# Patient Record
Sex: Female | Born: 1985 | Race: Black or African American | Hispanic: No | Marital: Married | Smoking: Never smoker
Health system: Southern US, Community
[De-identification: ages and names within clinical notes are randomized; demographics above are authoritative.]

## PROBLEM LIST (undated history)

## (undated) ENCOUNTER — Inpatient Hospital Stay (HOSPITAL_COMMUNITY): Payer: Self-pay

## (undated) DIAGNOSIS — D649 Anemia, unspecified: Secondary | ICD-10-CM

## (undated) DIAGNOSIS — R51 Headache: Secondary | ICD-10-CM

## (undated) DIAGNOSIS — N809 Endometriosis, unspecified: Secondary | ICD-10-CM

## (undated) DIAGNOSIS — Z9889 Other specified postprocedural states: Secondary | ICD-10-CM

## (undated) DIAGNOSIS — R112 Nausea with vomiting, unspecified: Secondary | ICD-10-CM

## (undated) DIAGNOSIS — O24419 Gestational diabetes mellitus in pregnancy, unspecified control: Secondary | ICD-10-CM

## (undated) DIAGNOSIS — R7303 Prediabetes: Secondary | ICD-10-CM

## (undated) HISTORY — DX: Gestational diabetes mellitus in pregnancy, unspecified control: O24.419

## (undated) HISTORY — DX: Endometriosis, unspecified: N80.9

## (undated) HISTORY — PX: DILATION AND CURETTAGE OF UTERUS: SHX78

---

## 2012-11-28 ENCOUNTER — Encounter: Payer: Self-pay | Admitting: Obstetrics & Gynecology

## 2012-11-28 ENCOUNTER — Ambulatory Visit (INDEPENDENT_AMBULATORY_CARE_PROVIDER_SITE_OTHER): Payer: BC Managed Care – PPO | Admitting: Obstetrics & Gynecology

## 2012-11-28 VITALS — BP 123/87 | HR 92 | Temp 98.4°F | Ht 60.0 in | Wt 175.8 lb

## 2012-11-28 DIAGNOSIS — Z3202 Encounter for pregnancy test, result negative: Secondary | ICD-10-CM

## 2012-11-28 DIAGNOSIS — Z01419 Encounter for gynecological examination (general) (routine) without abnormal findings: Secondary | ICD-10-CM

## 2012-11-28 DIAGNOSIS — N949 Unspecified condition associated with female genital organs and menstrual cycle: Secondary | ICD-10-CM

## 2012-11-28 LAB — RPR

## 2012-11-28 MED ORDER — NORETHIN ACE-ETH ESTRAD-FE 1-20 MG-MCG(24) PO TABS: 1.0000 | ORAL_TABLET | Freq: Every day | ORAL | Status: DC

## 2012-11-28 NOTE — Progress Notes (Signed)
.   Subjective:     Samantha Lee is a 27 y.o. female here for a problem exam.  Current complaints: abdominal pain for the last couple of years.  Over the past month the pain has worsen.   She describes the pain as cramping and pressure that comes and goes.  She went to [facility] last week and had testing done that did not show anything.  Personal health questionnaire reviewed: yes.   Gynecologic History Patient's last menstrual period was 11/22/2012. Contraception: none Last Pap: 2009. Results were: normal Last mammogram: N/A  Obstetric History OB History   Grav Para Term Preterm Abortions TAB SAB Ect Mult Living                   The following portions of the patient's history were reviewed and updated as appropriate: allergies, current medications, past family history, past medical history, past social history, past surgical history and problem list.  Review of Systems Pertinent items are noted in HPI.    Objective:    General appearance: alert Breasts: normal appearance, no masses or tenderness Abdomen: soft, non-tender; bowel sounds normal; no masses,  no organomegaly Pelvic: cervix normal in appearance, external genitalia normal, no adnexal masses or tenderness, uterus normal size, shape, and consistency and vagina normal without discharge, tenderness on palpation of the posterior CDS    Assessment:    Pelvic pain  Plan:    Will proceed w/a diagnostic laparoscopy.  The risks/benefits were reviewed with the pt.

## 2012-11-28 NOTE — Patient Instructions (Signed)
Diagnostic Laparoscopy Laparoscopy is a surgical procedure. It is used to diagnose and treat diseases inside the belly(abdomen). It is usually a brief, common, and relatively simple procedure. The laparoscopeis a thin, lighted, pencil-sized instrument. It is like a telescope. It is inserted into your abdomen through a small cut (incision). Your caregiver can look at the organs inside your body through this instrument. He or she can see if there is anything abnormal. Laparoscopy can be done either in a hospital or outpatient clinic. You may be given a mild sedative to help you relax before the procedure. Once in the operating room, you will be given a drug to make you sleep (general anesthesia). Laparoscopy usually lasts less than 1 hour. After the procedure, you will be monitored in a recovery area until you are stable and doing well. Once you are home, it will take 2 to 3 days to fully recover. RISKS AND COMPLICATIONS  Laparoscopy has relatively few risks. Your caregiver will discuss the risks with you before the procedure. Some problems that can occur include:  Infection.  Bleeding.  Damage to other organs.  Anesthetic side effects. PROCEDURE Once you receive anesthesia, your surgeon inflates the abdomen with a harmless gas (carbon dioxide). This makes the organs easier to see. The laparoscope is inserted into the abdomen through a small incision. This allows your surgeon to see into the abdomen. Other small instruments are also inserted into the abdomen through other small openings. Many surgeons attach a video camera to the laparoscope to enlarge the view. During a diagnostic laparoscopy, the surgeon may be looking for inflammation, infection, or cancer. Your surgeon may take tissue samples(biopsies). The samples are sent to a specialist in looking at cells and tissue samples (pathologist). The pathologist examines them under a microscope. Biopsies can help to diagnose or confirm a  disease. AFTER THE PROCEDURE   The gas is released from inside the abdomen.  The incisions are closed with stitches (sutures). Because these incisions are small (usually less than 1/2 inch), there is usually minimal discomfort after the procedure. There may be some mild discomfort in the throat. This is from the tube placed in the throat while you were sleeping. You may have some mild abdominal discomfort. There may also be discomfort from the instrument placement incisions in the abdomen.  The recovery time is shortened as long as there are no complications.  You will rest in a recovery room until stable and doing well. As long as there are no complications, you may be allowed to go home. FINDING OUT THE RESULTS OF YOUR TEST Not all test results are available during your visit. If your test results are not back during the visit, make an appointment with your caregiver to find out the results. Do not assume everything is normal if you have not heard from your caregiver or the medical facility. It is important for you to follow up on all of your test results. HOME CARE INSTRUCTIONS   Take all medicines as directed.  Only take over-the-counter or prescription medicines for pain, discomfort, or fever as directed by your caregiver.  Resume daily activities as directed.  Showers are preferred over baths.  You may resume sexual activities in 1 week or as directed.  Do not drive while taking narcotics. SEEK MEDICAL CARE IF:   There is increasing abdominal pain.  There is new pain in the shoulders (shoulder strap areas).  You feel lightheaded or faint.  You have the chills.  You or your  child has an oral temperature above 102 F (38.9 C).  There is pus-like (purulent) drainage from any of the wounds.  You are unable to pass gas or have a bowel movement.  You feel sick to your stomach (nauseous) or throw up (vomit). MAKE SURE YOU:   Understand these instructions.  Will watch  your condition.  Will get help right away if you are not doing well or get worse. Document Released: 08/27/2000 Document Revised: 08/13/2011 Document Reviewed: 05/21/2007 [facility] Patient Information 2014 [place], [place]. Pelvic Pain, Female Female pelvic pain can be caused by many different things and start from a variety of places. Pelvic pain refers to pain that is located in the lower half of the abdomen and between your hips. The pain may occur over a short period of time (acute) or may be reoccurring (chronic). The cause of pelvic pain may be related to disorders affecting the female reproductive organs (gynecologic), but it may also be related to the bladder, kidney stones, an intestinal complication, or muscle or skeletal problems. Getting help right away for pelvic pain is important, especially if there has been severe, sharp, or a sudden onset of unusual pain. It is also important to get help right away because some types of pelvic pain can be life threatening.  CAUSES  Below are only some of the causes of pelvic pain. The causes of pelvic pain can be in one of several categories.   Gynecologic.  Pelvic inflammatory disease.  Sexually transmitted infection.  Ovarian cyst or a twisted ovarian ligament (ovarian torsion).  Uterine lining that grows outside the uterus (endometriosis).  Fibroids, cysts, or tumors.  Ovulation.  Pregnancy.  Pregnancy that occurs outside the uterus (ectopic pregnancy).  Miscarriage.  Labor.  Abruption of the placenta or ruptured uterus.  Infection.  Uterine infection (endometritis).  Bladder infection.  Diverticulitis.  Miscarriage related to a uterine infection (septic abortion).  Bladder.  Inflammation of the bladder (cystitis).  Kidney stone(s).  Gastrointenstinal.  Constipation.  Diverticulitis.  Neurologic.  Trauma.  Feeling pelvic pain because of mental or emotional causes (psychosomatic).  Cancers of the bowel or  pelvis. EVALUATION  Your caregiver will want to take a careful history of your concerns. This includes recent changes in your health, a careful gynecologic history of your periods (menses), and a sexual history. Obtaining your family history and medical history is also important. Your caregiver may suggest a pelvic exam. A pelvic exam will help identify the location and severity of the pain. It also helps in the evaluation of which organ system may be involved. In order to identify the cause of the pelvic pain and be properly treated, your caregiver may order tests. These tests may include:   A pregnancy test.  Pelvic ultrasonography.  An X-ray exam of the abdomen.  A urinalysis or evaluation of vaginal discharge.  Blood tests. HOME CARE INSTRUCTIONS   Only take over-the-counter or prescription medicines for pain, discomfort, or fever as directed by your caregiver.   Rest as directed by your caregiver.   Eat a balanced diet.   Drink enough fluids to make your urine clear or pale yellow, or as directed.   Avoid sexual intercourse if it causes pain.   Apply warm or cold compresses to the lower abdomen depending on which one helps the pain.   Avoid stressful situations.   Keep a journal of your pelvic pain. Write down when it started, where the pain is located, and if there are things that seem  to be associated with the pain, such as food or your menstrual cycle.  Follow up with your caregiver as directed.  SEEK MEDICAL CARE IF:  Your medicine does not help your pain.  You have abnormal vaginal discharge. SEEK IMMEDIATE MEDICAL CARE IF:   You have heavy bleeding from the vagina.   Your pelvic pain increases.   You feel lightheaded or faint.   You have chills.   You have pain with urination or blood in your urine.   You have uncontrolled diarrhea or vomiting.   You have a fever or persistent symptoms for more than 3 days.  You have a fever and your  symptoms suddenly get worse.   You are being physically or sexually abused.  MAKE SURE YOU:  Understand these instructions.  Will watch your condition.  Will get help if you are not doing well or get worse. Document Released: 04/17/2004 Document Revised: 11/20/2011 Document Reviewed: 09/10/2011 [facility] Patient Information 2014 [place], [place].

## 2012-12-01 LAB — PAP IG, CT-NG, RFX HPV ASCU
Chlamydia Probe Amp: NEGATIVE
GC Probe Amp: NEGATIVE

## 2012-12-07 ENCOUNTER — Other Ambulatory Visit: Payer: Self-pay | Admitting: *Deleted

## 2012-12-07 DIAGNOSIS — N949 Unspecified condition associated with female genital organs and menstrual cycle: Secondary | ICD-10-CM

## 2012-12-09 ENCOUNTER — Other Ambulatory Visit: Payer: BC Managed Care – PPO

## 2012-12-12 ENCOUNTER — Ambulatory Visit (HOSPITAL_COMMUNITY)
Admission: RE | Admit: 2012-12-12 | Discharge: 2012-12-12 | Disposition: A | Discharge status: Home / Self Care | Payer: BC Managed Care – PPO | Source: Ambulatory Visit | Attending: Obstetrics & Gynecology | Admitting: Obstetrics & Gynecology

## 2012-12-12 DIAGNOSIS — N938 Other specified abnormal uterine and vaginal bleeding: Secondary | ICD-10-CM | POA: Insufficient documentation

## 2012-12-12 DIAGNOSIS — N949 Unspecified condition associated with female genital organs and menstrual cycle: Secondary | ICD-10-CM

## 2012-12-12 DIAGNOSIS — N854 Malposition of uterus: Secondary | ICD-10-CM | POA: Insufficient documentation

## 2012-12-15 ENCOUNTER — Other Ambulatory Visit: Payer: Self-pay | Admitting: *Deleted

## 2012-12-15 ENCOUNTER — Telehealth: Payer: Self-pay | Admitting: *Deleted

## 2012-12-15 MED ORDER — MEFENAMIC ACID 250 MG PO CAPS: ORAL_CAPSULE | ORAL | Status: DC

## 2012-12-26 ENCOUNTER — Encounter: Payer: Self-pay | Admitting: Obstetrics & Gynecology

## 2012-12-31 ENCOUNTER — Encounter (HOSPITAL_COMMUNITY): Payer: Self-pay

## 2012-12-31 ENCOUNTER — Encounter (HOSPITAL_COMMUNITY)
Admission: RE | Admit: 2012-12-31 | Discharge: 2012-12-31 | Disposition: A | Discharge status: Home / Self Care | Payer: BC Managed Care – PPO | Source: Ambulatory Visit | Attending: Obstetrics & Gynecology | Admitting: Obstetrics & Gynecology

## 2012-12-31 DIAGNOSIS — Z01818 Encounter for other preprocedural examination: Secondary | ICD-10-CM | POA: Insufficient documentation

## 2012-12-31 DIAGNOSIS — Z01812 Encounter for preprocedural laboratory examination: Secondary | ICD-10-CM | POA: Insufficient documentation

## 2012-12-31 HISTORY — DX: Headache: R51

## 2012-12-31 LAB — CBC
HCT: 38.9 % (ref 36.0–46.0)
Hemoglobin: 13.2 g/dL (ref 12.0–15.0)
MCHC: 33.9 g/dL (ref 30.0–36.0)
MCV: 90 fL (ref 78.0–100.0)

## 2012-12-31 NOTE — Patient Instructions (Addendum)
   Your procedure is scheduled on:  Thursday, August 14  Enter through the [facility] at: 1030 am Pick up the phone at the desk and dial [phone] and inform us of your arrival.  Please call this number if you have any problems the morning of surgery: [phone]  Remember: Do not eat food after midnight Wednesday.   Do not drink liquids after 8 am Thursday day of surgery. Take these medicines the morning of surgery with a SIP OF WATER: None  Do not wear jewelry, make-up, or FINGER nail polish No metal in your hair or on your body. Do not wear lotions, powders, perfumes. You may wear deodorant.  Please use your CHG wash as directed prior to surgery.  Do not shave anywhere for at least 12 hours prior to first CHG shower.  Do not bring valuables to the hospital. Contacts, dentures or bridgework may not be worn into surgery.  Patients discharged on the day of surgery will not be allowed to drive home.  Home with husband "Junior"

## 2013-01-01 ENCOUNTER — Encounter: Payer: Self-pay | Admitting: Obstetrics & Gynecology

## 2013-01-08 ENCOUNTER — Encounter (HOSPITAL_COMMUNITY): Payer: Self-pay | Admitting: Pharmacist

## 2013-01-12 ENCOUNTER — Other Ambulatory Visit: Payer: Self-pay | Admitting: *Deleted

## 2013-01-12 DIAGNOSIS — R102 Pelvic and perineal pain: Secondary | ICD-10-CM

## 2013-01-12 MED ORDER — TRAMADOL HCL 50 MG PO TABS: 50.0000 mg | ORAL_TABLET | Freq: Four times a day (QID) | ORAL | Status: DC | PRN

## 2013-01-13 ENCOUNTER — Other Ambulatory Visit: Payer: Self-pay | Admitting: *Deleted

## 2013-01-13 ENCOUNTER — Encounter: Payer: Self-pay | Admitting: Obstetrics & Gynecology

## 2013-01-13 ENCOUNTER — Encounter: Payer: Self-pay | Admitting: Obstetrics

## 2013-01-13 DIAGNOSIS — R102 Pelvic and perineal pain: Secondary | ICD-10-CM

## 2013-01-13 MED ORDER — TRAMADOL HCL 50 MG PO TABS: 50.0000 mg | ORAL_TABLET | Freq: Four times a day (QID) | ORAL | Status: DC | PRN

## 2013-01-15 ENCOUNTER — Encounter (HOSPITAL_COMMUNITY): Payer: Self-pay | Admitting: Anesthesiology

## 2013-01-15 ENCOUNTER — Encounter (HOSPITAL_COMMUNITY)
Admission: RE | Disposition: A | Discharge status: Home / Self Care | Payer: Self-pay | Source: Ambulatory Visit | Attending: Obstetrics & Gynecology

## 2013-01-15 ENCOUNTER — Ambulatory Visit (HOSPITAL_COMMUNITY): Payer: BC Managed Care – PPO | Admitting: Anesthesiology

## 2013-01-15 ENCOUNTER — Ambulatory Visit (HOSPITAL_COMMUNITY)
Admission: RE | Admit: 2013-01-15 | Discharge: 2013-01-15 | Disposition: A | Discharge status: Home / Self Care | Payer: BC Managed Care – PPO | Source: Ambulatory Visit | Attending: Obstetrics & Gynecology | Admitting: Obstetrics & Gynecology

## 2013-01-15 DIAGNOSIS — N809 Endometriosis, unspecified: Secondary | ICD-10-CM

## 2013-01-15 DIAGNOSIS — N949 Unspecified condition associated with female genital organs and menstrual cycle: Secondary | ICD-10-CM

## 2013-01-15 DIAGNOSIS — N736 Female pelvic peritoneal adhesions (postinfective): Secondary | ICD-10-CM

## 2013-01-15 DIAGNOSIS — N803 Endometriosis of pelvic peritoneum, unspecified: Secondary | ICD-10-CM | POA: Insufficient documentation

## 2013-01-15 DIAGNOSIS — G8929 Other chronic pain: Secondary | ICD-10-CM | POA: Insufficient documentation

## 2013-01-15 DIAGNOSIS — Z01419 Encounter for gynecological examination (general) (routine) without abnormal findings: Secondary | ICD-10-CM

## 2013-01-15 HISTORY — PX: LAPAROSCOPY: SHX197

## 2013-01-15 HISTORY — PX: LAPAROSCOPIC LYSIS OF ADHESIONS: SHX5905

## 2013-01-15 SURGERY — LAPAROSCOPY, DIAGNOSTIC
Anesthesia: General | Site: Abdomen | Laterality: Right | Wound class: Clean Contaminated

## 2013-01-15 MED ORDER — FENTANYL CITRATE 0.05 MG/ML IJ SOLN: 25.0000 ug | INTRAMUSCULAR | Status: DC | PRN

## 2013-01-15 MED ORDER — FENTANYL CITRATE 0.05 MG/ML IJ SOLN
INTRAMUSCULAR | Status: AC
  Filled 2013-01-15: qty 5

## 2013-01-15 MED ORDER — FENTANYL CITRATE 0.05 MG/ML IJ SOLN
INTRAMUSCULAR | Status: DC | PRN
  Administered 2013-01-15: 50 ug via INTRAVENOUS
  Administered 2013-01-15 (×2): 100 ug via INTRAVENOUS
  Administered 2013-01-15: 50 ug via INTRAVENOUS

## 2013-01-15 MED ORDER — GLYCOPYRROLATE 0.2 MG/ML IJ SOLN
INTRAMUSCULAR | Status: DC | PRN
  Administered 2013-01-15: 0.1 mg via INTRAVENOUS
  Administered 2013-01-15: .2 mg via INTRAVENOUS

## 2013-01-15 MED ORDER — NEOSTIGMINE METHYLSULFATE 1 MG/ML IJ SOLN
INTRAMUSCULAR | Status: DC | PRN
  Administered 2013-01-15: 1 mg via INTRAVENOUS

## 2013-01-15 MED ORDER — ACETAMINOPHEN 325 MG PO TABS: 650.0000 mg | ORAL_TABLET | ORAL | Status: DC | PRN

## 2013-01-15 MED ORDER — GLYCOPYRROLATE 0.2 MG/ML IJ SOLN
INTRAMUSCULAR | Status: AC
  Filled 2013-01-15: qty 3

## 2013-01-15 MED ORDER — OXYCODONE-ACETAMINOPHEN 5-325 MG PO TABS
ORAL_TABLET | ORAL | Status: AC
  Administered 2013-01-15: 1
  Filled 2013-01-15: qty 1

## 2013-01-15 MED ORDER — MIDAZOLAM HCL 2 MG/2ML IJ SOLN
INTRAMUSCULAR | Status: AC
  Filled 2013-01-15: qty 2

## 2013-01-15 MED ORDER — FENTANYL CITRATE 0.05 MG/ML IJ SOLN
INTRAMUSCULAR | Status: AC
  Administered 2013-01-15: 25 ug via INTRAVENOUS
  Filled 2013-01-15: qty 2

## 2013-01-15 MED ORDER — INDIGOTINDISULFONATE SODIUM 8 MG/ML IJ SOLN
INTRAMUSCULAR | Status: AC
  Filled 2013-01-15: qty 5

## 2013-01-15 MED ORDER — PROPOFOL 10 MG/ML IV BOLUS
INTRAVENOUS | Status: DC | PRN
  Administered 2013-01-15: 150 mg via INTRAVENOUS

## 2013-01-15 MED ORDER — FENTANYL CITRATE 0.05 MG/ML IJ SOLN
INTRAMUSCULAR | Status: AC
  Filled 2013-01-15: qty 2

## 2013-01-15 MED ORDER — ONDANSETRON HCL 4 MG/2ML IJ SOLN
INTRAMUSCULAR | Status: AC
  Administered 2013-01-15: 4 mg via INTRAVENOUS
  Filled 2013-01-15: qty 2

## 2013-01-15 MED ORDER — KETOROLAC TROMETHAMINE 30 MG/ML IJ SOLN: 30.0000 mg | Freq: Four times a day (QID) | INTRAMUSCULAR | Status: DC

## 2013-01-15 MED ORDER — MIDAZOLAM HCL 5 MG/5ML IJ SOLN
INTRAMUSCULAR | Status: DC | PRN
  Administered 2013-01-15: 2 mg via INTRAVENOUS

## 2013-01-15 MED ORDER — HYDROMORPHONE HCL PF 1 MG/ML IJ SOLN: INTRAMUSCULAR | Status: DC | PRN

## 2013-01-15 MED ORDER — ROCURONIUM BROMIDE 50 MG/5ML IV SOLN
INTRAVENOUS | Status: AC
  Filled 2013-01-15: qty 1

## 2013-01-15 MED ORDER — SODIUM CHLORIDE 0.9 % IV SOLN: 250.0000 mL | INTRAVENOUS | Status: DC | PRN

## 2013-01-15 MED ORDER — ONDANSETRON HCL 4 MG/2ML IJ SOLN: 4.0000 mg | Freq: Four times a day (QID) | INTRAMUSCULAR | Status: DC | PRN

## 2013-01-15 MED ORDER — BUPIVACAINE HCL (PF) 0.25 % IJ SOLN
INTRAMUSCULAR | Status: DC | PRN
  Administered 2013-01-15: 10 mL

## 2013-01-15 MED ORDER — ONDANSETRON HCL 4 MG/2ML IJ SOLN
INTRAMUSCULAR | Status: AC
  Filled 2013-01-15: qty 2

## 2013-01-15 MED ORDER — SODIUM CHLORIDE 0.9 % IJ SOLN: 3.0000 mL | Freq: Two times a day (BID) | INTRAMUSCULAR | Status: DC

## 2013-01-15 MED ORDER — DEXAMETHASONE SODIUM PHOSPHATE 10 MG/ML IJ SOLN
INTRAMUSCULAR | Status: DC | PRN
  Administered 2013-01-15: 10 mg via INTRAVENOUS

## 2013-01-15 MED ORDER — LIDOCAINE HCL (CARDIAC) 20 MG/ML IV SOLN
INTRAVENOUS | Status: AC
  Filled 2013-01-15: qty 5

## 2013-01-15 MED ORDER — OXYCODONE-ACETAMINOPHEN 5-325 MG PO TABS: 2.0000 | ORAL_TABLET | Freq: Four times a day (QID) | ORAL | Status: DC | PRN

## 2013-01-15 MED ORDER — KETOROLAC TROMETHAMINE 30 MG/ML IJ SOLN
INTRAMUSCULAR | Status: DC | PRN
  Administered 2013-01-15: 30 mg via INTRAVENOUS

## 2013-01-15 MED ORDER — PROMETHAZINE HCL 25 MG/ML IJ SOLN: 6.2500 mg | INTRAMUSCULAR | Status: DC | PRN

## 2013-01-15 MED ORDER — LACTATED RINGERS IR SOLN
Status: DC | PRN
  Administered 2013-01-15: 3000 mL

## 2013-01-15 MED ORDER — SCOPOLAMINE 1 MG/3DAYS TD PT72
MEDICATED_PATCH | TRANSDERMAL | Status: AC
  Administered 2013-01-15: 1.5 mg
  Filled 2013-01-15: qty 1

## 2013-01-15 MED ORDER — PROPOFOL 10 MG/ML IV EMUL
INTRAVENOUS | Status: AC
  Filled 2013-01-15: qty 20

## 2013-01-15 MED ORDER — MEPERIDINE HCL 25 MG/ML IJ SOLN: 6.2500 mg | INTRAMUSCULAR | Status: DC | PRN

## 2013-01-15 MED ORDER — MIDAZOLAM HCL 2 MG/2ML IJ SOLN: 0.5000 mg | Freq: Once | INTRAMUSCULAR | Status: DC | PRN

## 2013-01-15 MED ORDER — ONDANSETRON HCL 4 MG/2ML IJ SOLN
INTRAMUSCULAR | Status: DC | PRN
  Administered 2013-01-15: 4 mg via INTRAVENOUS

## 2013-01-15 MED ORDER — ROCURONIUM BROMIDE 100 MG/10ML IV SOLN
INTRAVENOUS | Status: DC | PRN
  Administered 2013-01-15: 5 mg via INTRAVENOUS
  Administered 2013-01-15: 30 mg via INTRAVENOUS

## 2013-01-15 MED ORDER — OXYCODONE HCL 5 MG PO TABS: 5.0000 mg | ORAL_TABLET | ORAL | Status: DC | PRN

## 2013-01-15 MED ORDER — SODIUM CHLORIDE 0.9 % IJ SOLN: 3.0000 mL | INTRAMUSCULAR | Status: DC | PRN

## 2013-01-15 MED ORDER — ACETAMINOPHEN 650 MG RE SUPP
650.0000 mg | RECTAL | Status: DC | PRN
  Filled 2013-01-15: qty 1

## 2013-01-15 MED ORDER — BUPIVACAINE HCL (PF) 0.25 % IJ SOLN
INTRAMUSCULAR | Status: AC
  Filled 2013-01-15: qty 30

## 2013-01-15 MED ORDER — NEOSTIGMINE METHYLSULFATE 1 MG/ML IJ SOLN
INTRAMUSCULAR | Status: AC
  Filled 2013-01-15: qty 1

## 2013-01-15 MED ORDER — LIDOCAINE HCL (CARDIAC) 20 MG/ML IV SOLN
INTRAVENOUS | Status: DC | PRN
  Administered 2013-01-15: 80 mg via INTRAVENOUS

## 2013-01-15 MED ORDER — KETOROLAC TROMETHAMINE 30 MG/ML IJ SOLN: 15.0000 mg | Freq: Once | INTRAMUSCULAR | Status: DC | PRN

## 2013-01-15 MED ORDER — LACTATED RINGERS IV SOLN
INTRAVENOUS | Status: DC
  Administered 2013-01-15 (×4): via INTRAVENOUS

## 2013-01-15 MED ORDER — METOCLOPRAMIDE HCL 5 MG/ML IJ SOLN
INTRAMUSCULAR | Status: AC
  Administered 2013-01-15: 10 mg
  Filled 2013-01-15: qty 2

## 2013-01-15 MED ORDER — DEXAMETHASONE SODIUM PHOSPHATE 10 MG/ML IJ SOLN
INTRAMUSCULAR | Status: AC
  Filled 2013-01-15: qty 1

## 2013-01-15 SURGICAL SUPPLY — 31 items
CABLE HIGH FREQUENCY MONO STRZ (ELECTRODE) ×3 IMPLANT
CATH ROBINSON RED A/P 16FR (CATHETERS) ×3 IMPLANT
CHLORAPREP W/TINT 26ML (MISCELLANEOUS) ×3 IMPLANT
CLOTH BEACON ORANGE TIMEOUT ST (SAFETY) ×3 IMPLANT
DERMABOND ADVANCED (GAUZE/BANDAGES/DRESSINGS) ×1
DERMABOND ADVANCED .7 DNX12 (GAUZE/BANDAGES/DRESSINGS) ×2 IMPLANT
FILTER SMOKE EVAC LAPAROSHD (FILTER) ×3 IMPLANT
GLOVE BIO SURGEON STRL SZ 6.5 (GLOVE) ×3 IMPLANT
GLOVE BIOGEL PI IND STRL 7.0 (GLOVE) ×2 IMPLANT
GLOVE BIOGEL PI IND STRL 8 (GLOVE) ×2 IMPLANT
GLOVE BIOGEL PI INDICATOR 7.0 (GLOVE) ×1
GLOVE BIOGEL PI INDICATOR 8 (GLOVE) ×1
GOWN PREVENTION PLUS LG XLONG (DISPOSABLE) ×6 IMPLANT
GOWN PREVENTION PLUS XLARGE (GOWN DISPOSABLE) ×3 IMPLANT
NS IRRIG 1000ML POUR BTL (IV SOLUTION) ×3 IMPLANT
PACK LAPAROSCOPY BASIN (CUSTOM PROCEDURE TRAY) ×3 IMPLANT
POUCH SPECIMEN RETRIEVAL 10MM (ENDOMECHANICALS) IMPLANT
PROTECTOR NERVE ULNAR (MISCELLANEOUS) ×3 IMPLANT
SCISSORS LAP 5X35 DISP (ENDOMECHANICALS) ×3 IMPLANT
SCRUB PCMX 4 OZ (MISCELLANEOUS) ×3 IMPLANT
SEALER TISSUE G2 CVD JAW 35 (ENDOMECHANICALS) ×2 IMPLANT
SEALER TISSUE G2 CVD JAW 45CM (ENDOMECHANICALS) ×1 IMPLANT
SET IRRIG TUBING LAPAROSCOPIC (IRRIGATION / IRRIGATOR) IMPLANT
SUT MNCRL AB 4-0 PS2 18 (SUTURE) IMPLANT
SUT VICRYL 0 UR6 27IN ABS (SUTURE) ×3 IMPLANT
SUT VICRYL 4-0 PS2 18IN ABS (SUTURE) IMPLANT
TOWEL OR 17X24 6PK STRL BLUE (TOWEL DISPOSABLE) ×6 IMPLANT
TRAY FOLEY CATH 14FR (SET/KITS/TRAYS/PACK) ×3 IMPLANT
TROCAR XCEL NON-BLD 11X100MML (ENDOMECHANICALS) ×3 IMPLANT
TROCAR XCEL NON-BLD 5MMX100MML (ENDOMECHANICALS) ×6 IMPLANT
WATER STERILE IRR 1000ML POUR (IV SOLUTION) ×3 IMPLANT

## 2013-01-15 NOTE — Op Note (Signed)
Procedure Note  LOURENE HOSTON 26 y.o. 01/15/2013  Preoperative Diagnosis:  Chronic pelvic pain Postoperative Diagnosis: Same, endometriosis; omental, uterine, anterior cul-de-sac adhesions  Procedure: Operative aparoscopy with lysis of adhesions for 45 minutes, ablation of endometriotic implants Surgeon: Antionette Char A  Indications:  The patient now presents for a diagnostic laparoscopy after discussing therapeutic alternatives.        Procedure Detail:  The patient was taken to the operating room and was placed on the operating table in the dorsal supine position.  After his satisfactory general anesthesia was achieved, the patient was placed in the semi-lithotomy position using Allen stirrups. The patient was prepped and draped in the usual sterile manner for vaginal laparoscopic procedure. A speculum was placed in the vagina. The anterior lip of the cervix was grasped with a single-tooth tenaculum. A Kahn cannula was then advanced into the uterus and secured  to the single tooth tenaculum as a  means to manipulate the uterus. The infraumbilical region was then anesthetized with local anesthesia, 0.25%  Marcaine. A small incision was made to the skin and subcutaneous tissue. A 5 mm Optiview trocar was placed through the incision into the abdominal cavity diagnostic laparoscope with video camera attached was placed through the trocar sleeve and carbon dioxide was used to insufflate the abdominal and pelvic cavity. The pelvic contents were examined and the findings were described below. Bilateral lower quadrant 5 mm ports were placed under direct visualization.  The omental adhesions to the anterior abdominal wall were then divided with the Enseal device. The broad, fibrous uterine adhesion to anterior abdominal was was divided with monopolar cautery.  The adhesions of the anterior cul-de-sac were divided with monopolar cautery and the Enseal device.  Adhesions along the left abdominal side  wall were divided with the scissors.  The endometriotic implant on the right uterosacral ligament was not excised or ablated as it was in close proximity to the ureter.  The endometriotic implants on the right abdominal side wall were either excised or ablated with monopolar cautery.  The pelvis was irrigated and adequate hemostasis was noted.   The lower quadrant ports were removed. The scope was removed and the excess carbon dioxide was remove through the port, before it was removed.  The skin incisions were reapproximated with running subcuticluar stitches of 4-0 Vicryl suture. A skin adhesive was then applied.  The instruments were removed from the vagina and there was minimal bleeding from the cervix. Final sponge, instrument and needle counts were correct. The patient was awakened on the operating table and taken to the PACU in satisfactory condition.    Findings: Right uterosacral ligament with a fibrotic plaque, tethering of the peritoneum; multiple hemorrhagic implants along the right abdominal side wall, omental adhesions to the anterior abdominal wall, uterine adhesion to the anterior abdominal wall, adhesions of the anterior cul-de-sac.  Ovaries, left abdominal sidewall and appendix all normal in appearance.  Estimated Blood Loss:  less than 100 mL              Total IV Fluids: per Anesthesiology        Condition: stable

## 2013-01-15 NOTE — Anesthesia Postprocedure Evaluation (Signed)
  Anesthesia Post-op Note  Anesthesia Post Note  Patient: Samantha Lee  Procedure(s) Performed: Procedure(s) (LRB): LAPAROSCOPY DIAGNOSTIC   (N/A) LAPAROSCOPIC LYSIS OF ADHESIONS (N/A) LAPAROSCOPY OPERATIVE   (Right)  Anesthesia type: General  Patient location: PACU  Post pain: Pain level controlled  Post assessment: Post-op Vital signs reviewed  Last Vitals:  Filed Vitals:   01/15/13 1630  BP: 103/56  Pulse: 70  Temp: 36.9 C  Resp: 16    Post vital signs: Reviewed  Level of consciousness: sedated  Complications: No apparent anesthesia complications

## 2013-01-15 NOTE — Transfer of Care (Signed)
Immediate Anesthesia Transfer of Care Note  Patient: Samantha Lee  Procedure(s) Performed: Procedure(s) with comments: LAPAROSCOPY DIAGNOSTIC   (N/A) LAPAROSCOPIC LYSIS OF ADHESIONS (N/A) - with ablation of endometriotic implants  LAPAROSCOPY OPERATIVE   (Right) - R sidewall biopsy  Patient Location: PACU  Anesthesia Type:General  Level of Consciousness: awake, alert  and oriented  Airway & Oxygen Therapy: Patient Spontanous Breathing and Patient connected to nasal cannula oxygen  Post-op Assessment: Report given to PACU RN and Post -op Vital signs reviewed and stable  Post vital signs: Reviewed and stable  Complications: No apparent anesthesia complications

## 2013-01-15 NOTE — Anesthesia Procedure Notes (Signed)
Procedure Name: Intubation Date/Time: 01/15/2013 12:36 PM Performed by: Graciela Husbands Pre-anesthesia Checklist: Suction available, Emergency Drugs available, Timeout performed, Patient identified and Patient being monitored Patient Re-evaluated:Patient Re-evaluated prior to inductionOxygen Delivery Method: Circle system utilized Preoxygenation: Pre-oxygenation with 100% oxygen Intubation Type: IV induction Ventilation: Oral airway inserted - appropriate to patient size and Mask ventilation with difficulty Laryngoscope Size: Mac and 3 Grade View: Grade I Tube type: Oral Tube size: 7.0 mm Number of attempts: 1 Airway Equipment and Method: Patient positioned with wedge pillow and Stylet Placement Confirmation: ETT inserted through vocal cords under direct vision,  positive ETCO2 and breath sounds checked- equal and bilateral Secured at: 21 cm Tube secured with: Tape Dental Injury: Teeth and Oropharynx as per pre-operative assessment  Difficulty Due To: Difficult Airway- due to large tongue

## 2013-01-15 NOTE — H&P (Signed)
  Chief Complaint: 27 y.o.  who presents with pelvic pain  Details of Present Illness: See above.  BP 111/58  Pulse 81  Temp(Src) 98.2 F (36.8 C) (Oral)  Resp 18  Ht 5\' 1"  (1.549 m)  Wt 184 lb (83.462 kg)  BMI 34.78 kg/m2  SpO2 100%  Past Medical History  Diagnosis Date  . Headache(784.0)     otc med prn   History   Social History  . Marital Status: Married    Spouse Name: N/A    Number of Children: N/A  . Years of Education: N/A   Occupational History  . Not on file.   Social History Main Topics  . Smoking status: Never Smoker   . Smokeless tobacco: Never Used  . Alcohol Use: No  . Drug Use: No  . Sexual Activity: Yes    Birth Control/ Protection: Condom   Other Topics Concern  . Not on file   Social History Narrative  . No narrative on file   Family History  Problem Relation Age of Onset  . Hypertension Mother     Pertinent items are noted in HPI.  Pre-Op Diagnosis: Pelvic pain   Planned Procedure: Procedure(s): LAPAROSCOPY DIAGNOSTIC, POSSIBLE OPERATIVE LAPAROSCOPY WITH BIOPSIES   I have reviewed the patient's history and have completed the physical exam and Samantha Lee is acceptable for surgery.  Roseanna Rainbow, MD 01/15/2013 12:06 PM

## 2013-01-15 NOTE — Anesthesia Preprocedure Evaluation (Signed)
Anesthesia Evaluation  Patient identified by MRN, date of birth, ID band Patient awake    Reviewed: Allergy & Precautions, H&P , Patient's Chart, lab work & pertinent test results, reviewed documented beta blocker date and time   History of Anesthesia Complications Negative for: history of anesthetic complications  Airway Mallampati: III TM Distance: >3 FB Neck ROM: full    Dental no notable dental hx.    Pulmonary neg pulmonary ROS,  breath sounds clear to auscultation  Pulmonary exam normal       Cardiovascular Exercise Tolerance: Good negative cardio ROS  Rhythm:regular Rate:Normal     Neuro/Psych negative neurological ROS  negative psych ROS   GI/Hepatic negative GI ROS, Neg liver ROS,   Endo/Other  negative endocrine ROS  Renal/GU negative Renal ROS     Musculoskeletal   Abdominal   Peds  Hematology negative hematology ROS (+)   Anesthesia Other Findings   Reproductive/Obstetrics negative OB ROS                           Anesthesia Physical Anesthesia Plan  ASA: II  Anesthesia Plan: General ETT   Post-op Pain Management:    Induction:   Airway Management Planned:   Additional Equipment:   Intra-op Plan:   Post-operative Plan:   Informed Consent: I have reviewed the patients History and Physical, chart, labs and discussed the procedure including the risks, benefits and alternatives for the proposed anesthesia with the patient or authorized representative who has indicated his/her understanding and acceptance.   Dental Advisory Given  Plan Discussed with: CRNA and Surgeon  Anesthesia Plan Comments:         Anesthesia Quick Evaluation

## 2013-01-16 ENCOUNTER — Encounter (HOSPITAL_COMMUNITY): Payer: Self-pay | Admitting: Obstetrics & Gynecology

## 2013-01-20 ENCOUNTER — Encounter (HOSPITAL_COMMUNITY): Payer: Self-pay | Admitting: *Deleted

## 2013-01-20 ENCOUNTER — Encounter: Payer: Self-pay | Admitting: Obstetrics & Gynecology

## 2013-01-20 ENCOUNTER — Inpatient Hospital Stay (HOSPITAL_COMMUNITY)
Admission: AD | Admit: 2013-01-20 | Discharge: 2013-01-20 | Disposition: A | Discharge status: Home / Self Care | Payer: BC Managed Care – PPO | Source: Ambulatory Visit | Attending: Obstetrics | Admitting: Obstetrics

## 2013-01-20 DIAGNOSIS — R519 Headache, unspecified: Secondary | ICD-10-CM

## 2013-01-20 DIAGNOSIS — R51 Headache: Secondary | ICD-10-CM

## 2013-01-20 DIAGNOSIS — K5900 Constipation, unspecified: Secondary | ICD-10-CM

## 2013-01-20 DIAGNOSIS — R112 Nausea with vomiting, unspecified: Secondary | ICD-10-CM

## 2013-01-20 LAB — URINALYSIS, ROUTINE W REFLEX MICROSCOPIC
Bilirubin Urine: NEGATIVE
Glucose, UA: NEGATIVE mg/dL
Hgb urine dipstick: NEGATIVE
Ketones, ur: NEGATIVE mg/dL
Leukocytes, UA: NEGATIVE
Nitrite: NEGATIVE
Protein, ur: NEGATIVE mg/dL
Specific Gravity, Urine: 1.015 (ref 1.005–1.030)
Urobilinogen, UA: 0.2 mg/dL (ref 0.0–1.0)
pH: 8 (ref 5.0–8.0)

## 2013-01-20 LAB — CBC
HCT: 38.5 % (ref 36.0–46.0)
Hemoglobin: 13.1 g/dL (ref 12.0–15.0)
MCH: 30.5 pg (ref 26.0–34.0)
MCHC: 34 g/dL (ref 30.0–36.0)
MCV: 89.7 fL (ref 78.0–100.0)
Platelets: 252 10*3/uL (ref 150–400)
RBC: 4.29 MIL/uL (ref 3.87–5.11)
RDW: 12.6 % (ref 11.5–15.5)
WBC: 11 10*3/uL — ABNORMAL HIGH (ref 4.0–10.5)

## 2013-01-20 MED ORDER — DEXAMETHASONE SODIUM PHOSPHATE 10 MG/ML IJ SOLN
10.0000 mg | Freq: Once | INTRAMUSCULAR | Status: AC
  Administered 2013-01-20: 10 mg via INTRAVENOUS
  Filled 2013-01-20: qty 1

## 2013-01-20 MED ORDER — LACTATED RINGERS IV BOLUS (SEPSIS)
500.0000 mL | Freq: Once | INTRAVENOUS | Status: AC
  Administered 2013-01-20: 17:00:00 via INTRAVENOUS

## 2013-01-20 MED ORDER — PROMETHAZINE HCL 25 MG PO TABS: 25.0000 mg | ORAL_TABLET | Freq: Four times a day (QID) | ORAL | Status: DC | PRN

## 2013-01-20 MED ORDER — DIPHENHYDRAMINE HCL 50 MG/ML IJ SOLN
25.0000 mg | Freq: Once | INTRAMUSCULAR | Status: AC
  Administered 2013-01-20: 25 mg via INTRAVENOUS
  Filled 2013-01-20: qty 1

## 2013-01-20 MED ORDER — METOCLOPRAMIDE HCL 5 MG/ML IJ SOLN
10.0000 mg | Freq: Once | INTRAMUSCULAR | Status: AC
  Administered 2013-01-20: 10 mg via INTRAVENOUS
  Filled 2013-01-20: qty 2

## 2013-01-20 MED ORDER — LACTATED RINGERS IV BOLUS (SEPSIS): 1000.0000 mL | Freq: Once | INTRAVENOUS | Status: DC

## 2013-01-20 NOTE — MAU Note (Signed)
I had a laporoscopy last THurs. I'm having n/v, headache and no BM since last TUes. My stomach is swollen.

## 2013-01-20 NOTE — MAU Provider Note (Signed)
History     CSN: [id number]  Arrival date and time: 01/20/13 1439   First Provider Initiated Contact with Patient 01/20/13 1537      Chief Complaint  Patient presents with  . Post-op Problem   HPI Ms. Samantha Renaldo Bradleyis a 27 y.o.female who presents with N/V, constipation, and headache following a laparoscopy for lysis of adhesions on 01/15/2013. She has been taking percocet for the HA with minimal relief; it puts her to sleep and when she wakes up she still has HA.  She has had nausea and each time she tries to eat she vomits. She tried a chicken biscuit this morning for breakfast and was unable to keep it down.  Last bowel movement was 1 week ago; she has tried stool softers and prune juice with no relief.  Dr. Clearance Coots instructed patient to come here to be evaluated; she has been talking to him for the past 2 days.   OB History   Grav Para Term Preterm Abortions TAB SAB Ect Mult Living   3 2 2  1  1          Past Medical History  Diagnosis Date  . Headache(784.0)     otc med prn    Past Surgical History  Procedure Laterality Date  . Cesarean section      x 2  . Dilation and curettage of uterus      mab  . Laparoscopy N/A 01/15/2013    Procedure: LAPAROSCOPY DIAGNOSTIC  ;  Surgeon: Antionette Char, MD;  Location: [facility];  Service: Gynecology;  Laterality: N/A;  . Laparoscopic lysis of adhesions N/A 01/15/2013    Procedure: LAPAROSCOPIC LYSIS OF ADHESIONS;  Surgeon: Antionette Char, MD;  Location: [facility];  Service: Gynecology;  Laterality: N/A;  with ablation of endometriotic implants   . Laparoscopy Right 01/15/2013    Procedure: LAPAROSCOPY OPERATIVE  ;  Surgeon: Antionette Char, MD;  Location: [facility];  Service: Gynecology;  Laterality: Right;  R sidewall biopsy    Family History  Problem Relation Age of Onset  . Hypertension Mother     History  Substance Use Topics  . Smoking status: Never Smoker   . Smokeless tobacco: Never Used  . Alcohol Use: No     Allergies: No Known Allergies  Prescriptions prior to admission  Medication Sig Dispense Refill  . Mefenamic Acid (PONSTEL) 250 MG CAPS 500 mg PO loading dose and then start 250 mg tablet PO every 6 hours PRN.  60 each  2  . oxyCODONE-acetaminophen (PERCOCET) 5-325 MG per tablet Take 2 tablets by mouth every 6 (six) hours as needed for pain.  60 tablet  0  . Norethindrone Acetate-Ethinyl Estrad-FE (LOESTRIN 24 FE) 1-20 MG-MCG(24) tablet Take 1 tablet by mouth daily.  1 Package  11   Results for orders placed during the hospital encounter of 01/20/13 (from the past 24 hour(s))  URINALYSIS, ROUTINE W REFLEX MICROSCOPIC     Status: None   Collection Time    01/20/13  3:10 PM      Result Value Range   Color, Urine YELLOW  YELLOW   APPearance CLEAR  CLEAR   Specific Gravity, Urine 1.015  1.005 - 1.030   pH 8.0  5.0 - 8.0   Glucose, UA NEGATIVE  NEGATIVE mg/dL   Hgb urine dipstick NEGATIVE  NEGATIVE   Bilirubin Urine NEGATIVE  NEGATIVE   Ketones, ur NEGATIVE  NEGATIVE mg/dL   Protein, ur NEGATIVE  NEGATIVE mg/dL   Urobilinogen,  UA 0.2  0.0 - 1.0 mg/dL   Nitrite NEGATIVE  NEGATIVE   Leukocytes, UA NEGATIVE  NEGATIVE  CBC     Status: Abnormal   Collection Time    01/20/13  4:10 PM      Result Value Range   WBC 11.0 (*) 4.0 - 10.5 K/uL   RBC 4.29  3.87 - 5.11 MIL/uL   Hemoglobin 13.1  12.0 - 15.0 g/dL   HCT 32.4  40.1 - 02.7 %   MCV 89.7  78.0 - 100.0 fL   MCH 30.5  26.0 - 34.0 pg   MCHC 34.0  30.0 - 36.0 g/dL   RDW 25.3  66.4 - 40.3 %   Platelets 252  150 - 400 K/uL   Review of Systems  Constitutional: Positive for chills. Negative for fever.  Cardiovascular: Negative for leg swelling.  Gastrointestinal: Positive for nausea, vomiting, abdominal pain and constipation.       Bilateral lower abdominal pain   Genitourinary: Negative for dysuria.  Neurological: Positive for headaches.   Physical Exam   Blood pressure 119/75, pulse 77, temperature 98.4 F (36.9 C), resp.  rate 18, last menstrual period 11/22/2012.  Physical Exam  Constitutional: She is oriented to person, place, and time. She appears well-developed and well-nourished. No distress.  Neck: Neck supple.  Cardiovascular: Normal rate.   Respiratory: Breath sounds normal. No respiratory distress.  GI: Soft. She exhibits distension. There is tenderness. There is no rebound and no guarding.  Tenderness at incision sites.  Bowel sounds hypoactive in all 4 quadrants   Musculoskeletal: She exhibits no edema.  Neurological: She is alert and oriented to person, place, and time.  Skin: Skin is warm.    MAU Course  Procedures  IV access 500 CC LR infusion Headache cocktail: Benadryl 25 mg IV, Reglan 10 mg IV, Decadron 10 mg IV  CBC Headache pain 0/10 upon discharge home  Assessment and Plan  A: Headache Constipation    P:  Discharge home Phenergan as needed for N/V 25 mg PO Q6 hours as needed (#20) No RF If pain resumes, follow up with Dr. Bary Leriche  as indicated on the bottle; first dose tonight If unsuccessful BM with Miralax, ok to try enema over the counter.  Increase your fluids to 8 glasses of water per day.    RASCH, JENNIFER IRENE FNP-C 01/20/2013, 3:37 PM

## 2013-01-21 ENCOUNTER — Encounter: Payer: Self-pay | Admitting: Obstetrics

## 2013-01-28 ENCOUNTER — Encounter: Payer: Self-pay | Admitting: Obstetrics & Gynecology

## 2013-01-28 ENCOUNTER — Ambulatory Visit (INDEPENDENT_AMBULATORY_CARE_PROVIDER_SITE_OTHER): Payer: BC Managed Care – PPO | Admitting: Obstetrics & Gynecology

## 2013-01-28 VITALS — BP 104/80 | HR 76 | Temp 99.0°F

## 2013-01-28 DIAGNOSIS — N23 Unspecified renal colic: Secondary | ICD-10-CM

## 2013-01-28 DIAGNOSIS — N809 Endometriosis, unspecified: Secondary | ICD-10-CM | POA: Insufficient documentation

## 2013-01-28 DIAGNOSIS — R309 Painful micturition, unspecified: Secondary | ICD-10-CM

## 2013-01-28 DIAGNOSIS — Z09 Encounter for follow-up examination after completed treatment for conditions other than malignant neoplasm: Secondary | ICD-10-CM

## 2013-01-28 LAB — POCT URINALYSIS DIPSTICK
Blood, UA: NEGATIVE
Leukocytes, UA: NEGATIVE
Nitrite, UA: NEGATIVE
Protein, UA: NEGATIVE
pH, UA: 7

## 2013-01-28 NOTE — Patient Instructions (Signed)
Endometriosis  Endometriosis is a disease that occurs when the endometrium (lining of the uterus) is misplaced outside of its normal location. It may occur in many locations close to the uterus (womb), but commonly on the ovaries, fallopian tubes, vagina (birth canal) and bowel located close to the uterus. Because the uterus sloughs (expels) its lining every month (menses), there is bleeding whereever the endometrial tissue is located.  SYMPTOMS   Often there are no symptoms. However, because blood is irritating to tissues not normally exposed to it, when symptoms occur they vary with the location of the misplaced endometrium. Symptoms often include back and abdominal pain. Periods may be heavier and intercourse may be painful. Infertility may be present. You may have all of these symptoms at one time or another or you may have months with no symptoms at all. Although the symptoms occur mainly during menses, they can occur mid-cycle as well, and usually terminate with menopause.  DIAGNOSIS   Your caregiver may recommend a blood test and urine test (urinalysis) to help rule out other conditions. Another common test is ultrasound, a painless procedure that uses sound waves to make a sonogram "picture" of abnormal tissue that could be endometriosis. If your bowel movements are painful around your periods, your caregiver may advise a barium enema (an X-ray of the lower bowel), to try to find the source of your pain. This is sometimes confirmed by laparoscopy. Laparoscopy is a procedure where your caregiver looks into your abdomen with a laparoscope (a small pencil sized telescope). Your caregiver may take a tiny piece of tissue (biopsy) from any abnormal tissue to confirm or document your problem. These tissues are sent to the lab and a pathologist looks at them under the microscope to give a microscopic diagnosis.  TREATMENT   Once the diagnosis is made, it can be treated by destruction of the misplaced endometrial  tissue using heat (diathermy), laser, cutting (excision), or chemical means. It may also be treated with hormonal therapy. When using hormonal therapy menses are eliminated, therefore eliminating the monthly exposure to blood by the misplaced endometrial tissue. Only in severe cases is it necessary to perform a hysterectomy with removal of the tubes, uterus and ovaries.  HOME CARE INSTRUCTIONS    Only take over-the-counter or prescription medicines for pain, discomfort, or fever as directed by your caregiver.   Avoid activities that produce pain, including a physical sexual relationship.   Do not take aspirin as this may increase bleeding when not on hormonal therapy.   See your caregiver for pain or problems not controlled with treatment.  SEEK IMMEDIATE MEDICAL CARE IF:    Your pain is severe and is not responding to pain medicine.   You develop severe nausea and vomiting, or you cannot keep foods down.   Your pain localizes to the right lower part of your abdomen (possible appendicitis).   You have swelling or increasing pain in the abdomen.   You have a fever.   You see blood in your stool.  MAKE SURE YOU:    Understand these instructions.   Will watch your condition.   Will get help right away if you are not doing well or get worse.  Document Released: 05/18/2000 Document Revised: 08/13/2011 Document Reviewed: 01/07/2008  [employer] Patient Information 2014 [employer].

## 2013-01-28 NOTE — Progress Notes (Signed)
Subjective:     Samantha Lee is a 27 y.o. female who presents to the clinic 2 weeks status post laparoscopy for pelvic pain. Eating a regular diet with out difficulty. Bowel movements are normal. Pain is controlled with current analgesics. Medications being used: narcotic analgesics including percocet. Patient reports she is having some urinary pain.  The following portions of the patient's history were reviewed and updated as appropriate: allergies, current medications, past family history, past medical history, past social history, past surgical history and problem list.  Review of Systems Pertinent items are noted in HPI.    Objective:    BP 104/80  Pulse 76  Temp(Src) 99 F (37.2 C)  LMP 11/22/2012 General:  alert  Abdomen: soft, bowel sounds active, non-tender  Incision:   healing well, no drainage, no erythema, no hernia, no seroma, no swelling, no dehiscence, incision well approximated     Assessment:    Doing well postoperatively. Dysuria Operative findings again reviewed. Pathology report discussed.    Plan:    1. Continue any current medications. 2. Wound care discussed. 3. Activity restrictions: no intercourse 4. Anticipated return to work: now. 5. Follow up: 4 weeks  Check urine C&S Continuous COCP

## 2013-02-16 ENCOUNTER — Encounter: Payer: Self-pay | Admitting: Obstetrics & Gynecology

## 2013-03-04 ENCOUNTER — Encounter: Payer: Self-pay | Admitting: *Deleted

## 2013-03-04 ENCOUNTER — Encounter: Payer: Self-pay | Admitting: Obstetrics & Gynecology

## 2013-03-10 ENCOUNTER — Encounter: Payer: Self-pay | Admitting: Obstetrics & Gynecology

## 2013-03-11 ENCOUNTER — Encounter: Payer: Self-pay | Admitting: Obstetrics & Gynecology

## 2013-03-11 ENCOUNTER — Encounter: Payer: Self-pay | Admitting: Obstetrics

## 2013-03-11 ENCOUNTER — Ambulatory Visit (INDEPENDENT_AMBULATORY_CARE_PROVIDER_SITE_OTHER): Payer: BC Managed Care – PPO | Admitting: Obstetrics & Gynecology

## 2013-03-11 VITALS — BP 115/79 | HR 69 | Temp 99.5°F | Ht 61.0 in | Wt 195.0 lb

## 2013-03-11 DIAGNOSIS — N809 Endometriosis, unspecified: Secondary | ICD-10-CM

## 2013-03-11 DIAGNOSIS — N939 Abnormal uterine and vaginal bleeding, unspecified: Secondary | ICD-10-CM

## 2013-03-11 DIAGNOSIS — N926 Irregular menstruation, unspecified: Secondary | ICD-10-CM

## 2013-03-11 DIAGNOSIS — Z09 Encounter for follow-up examination after completed treatment for conditions other than malignant neoplasm: Secondary | ICD-10-CM

## 2013-03-11 LAB — HEMOGLOBIN: Hemoglobin: 12.7 g/dL (ref 12.0–15.0)

## 2013-03-11 MED ORDER — NAPROXEN SODIUM ER 500 MG PO TB24: 1000.0000 mg | ORAL_TABLET | Freq: Every day | ORAL | Status: DC

## 2013-03-11 MED ORDER — MEDROXYPROGESTERONE ACETATE 10 MG PO TABS: 20.0000 mg | ORAL_TABLET | Freq: Three times a day (TID) | ORAL | Status: DC

## 2013-03-11 NOTE — Addendum Note (Signed)
Addended by: Elby Beck F on: 03/11/2013 02:13 PM   Modules accepted: Orders

## 2013-03-11 NOTE — Progress Notes (Signed)
Subjective:     Samantha Lee is a 27 y.o. female here for a routine exam.  Current complaints: post-op problem visit.  Pt states she had a laparoscopy with lysis of adhesions 01-15-13. Pt states she that she started a cycle on 02-28-13 and it has continued for the last 12 days. Pt states she is having moderate to heavy bleeding. Pt states she is having severe pain since her cycle has started. Pt states she has been taking continuous birth control pills since July 2014. Pt had been instructed to start taking three birth control pills daily to help stop the bleeding. Pt is compliant with requested therapy and has continued to bleed.  Pt states she is having nausea. Pt states she is also very fatigued. Personal health questionnaire reviewed: yes.   Gynecologic History Patient's last menstrual period was 02/28/2013. Contraception: OCP (estrogen/progesterone)   Obstetric History OB History  Gravida Para Term Preterm AB SAB TAB Ectopic Multiple Living  3 2 2  1 1         # Outcome Date GA Lbr Len/2nd Weight Sex Delivery Anes PTL Lv  3 TRM 08/18/07    F LTCS     2 TRM 07/21/03    F LTCS     1 SAB                The following portions of the patient's history were reviewed and updated as appropriate: allergies, current medications, past family history, past medical history, past social history, past surgical history and problem list.  Review of Systems Pertinent items are noted in HPI.    Objective:     Abd: incisions healed/NT SPEC: scant heme Bimanual: uterus AV, globular, NT; no adnexal masses     Assessment:   AUB-?I--prolonged bleeding episode Pelvic pain, endometriosis  Plan:    High dose provera x 1 week-->COCP BID Long term treatment options reviewed PT referral Naprelan Return in 2 weeks

## 2013-03-11 NOTE — Patient Instructions (Signed)
Leuprolide depot injection or implant What is this medicine? LEUPROLIDE (loo PROE lide) is a man-made protein that acts like a natural hormone in the body. It decreases testosterone in men and decreases estrogen in women. In men, this medicine is used to treat advanced prostate cancer. In women, some forms of this medicine may be used to treat endometriosis, uterine fibroids, or other female hormone-related problems. This medicine may be used for other purposes; ask your health care provider or pharmacist if you have questions. What should I tell my health care provider before I take this medicine? They need to know if you have any of these conditions: -diabetes -heart disease or previous heart attack -high blood pressure -high cholesterol -osteoporosis -pain or difficulty passing urine -spinal cord metastasis -stroke -tobacco smoker -unusual vaginal bleeding (women) -an unusual or allergic reaction to leuprolide, benzyl alcohol, other medicines, foods, dyes, or preservatives -pregnant or trying to get pregnant -breast-feeding How should I use this medicine? This medicine is for injection into a muscle or for implant or injection under the skin. It is given by a health care professional in a hospital or clinic setting. The specific product will determine how it will be given to you. Make sure you understand which product you receive and how often you will receive it. Talk to your pediatrician regarding the use of this medicine in children. Special care may be needed. Overdosage: If you think you have taken too much of this medicine contact a poison control center or emergency room at once. NOTE: This medicine is only for you. Do not share this medicine with others. What if I miss a dose? It is important not to miss a dose. Call your doctor or health care professional if you are unable to keep an appointment. Depot injections: Depot injections are given either once-monthly, every 12 weeks,  every 16 weeks, or every 24 weeks depending on the product you are prescribed. The product you are prescribed will be based on if you are female or female, and your condition. Make sure you understand your product and dosing. Implant dosing: The implant is removed and replaced once a year. The implant is only used in males. What may interact with this medicine? Do not take this medicine with any of the following medications: -chasteberry This medicine may also interact with the following medications: -herbal or dietary supplements, like black cohosh or DHEA -female hormones, like estrogens or progestins and birth control pills, patches, rings, or injections -female hormones, like testosterone This list may not describe all possible interactions. Give your health care provider a list of all the medicines, herbs, non-prescription drugs, or dietary supplements you use. Also tell them if you smoke, drink alcohol, or use illegal drugs. Some items may interact with your medicine. What should I watch for while using this medicine? Visit your doctor or health care professional for regular checks on your progress. During the first weeks of treatment, your symptoms may get worse, but then will improve as you continue your treatment. You may get hot flashes, increased bone pain, increased difficulty passing urine, or an aggravation of nerve symptoms. Discuss these effects with your doctor or health care professional, some of them may improve with continued use of this medicine. Female patients may experience a menstrual cycle or spotting during the first months of therapy with this medicine. If this continues, contact your doctor or health care professional. What side effects may I notice from receiving this medicine? Side effects that you should report  to your doctor or health care professional as soon as possible: -allergic reactions like skin rash, itching or hives, swelling of the face, lips, or  tongue -breathing problems -chest pain -depression or memory disorders -pain in your legs or groin -pain at site where injected or implanted -severe headache -swelling of the feet and legs -visual changes -vomiting Side effects that usually do not require medical attention (report to your doctor or health care professional if they continue or are bothersome): -breast swelling or tenderness -decrease in sex drive or performance -diarrhea -hot flashes -loss of appetite -muscle, joint, or bone pains -nausea -redness or irritation at site where injected or implanted -skin problems or acne This list may not describe all possible side effects. Call your doctor for medical advice about side effects. You may report side effects to FDA at 1-800-FDA-1088. Where should I keep my medicine? This drug is given in a hospital or clinic and will not be stored at home. NOTE: This sheet is a summary. It may not cover all possible information. If you have questions about this medicine, talk to your doctor, pharmacist, or health care provider.  2013, [employer]. (11/22/2009 2:41:21 PM) Letrozole tablets What is this medicine? LETROZOLE (LET roe zole) blocks the production of estrogen. Certain types of breast cancer grow under the influence of estrogen. Letrozole helps block tumor growth. This medicine is used to treat advanced breast cancer in postmenopausal women. This medicine may be used for other purposes; ask your health care provider or pharmacist if you have questions. What should I tell my health care provider before I take this medicine? They need to know if you have any of these conditions: -liver disease -osteoporosis (weak bones) -an unusual or allergic reaction to letrozole, other medicines, foods, dyes, or preservatives -pregnant or trying to get pregnant -breast-feeding How should I use this medicine? Take this medicine by mouth with a glass of water. You may take it with  or without food. Follow the directions on the prescription label. Take your medicine at regular intervals. Do not take your medicine more often than directed. Do not stop taking except on your doctor's advice. Talk to your pediatrician regarding the use of this medicine in children. Special care may be needed. Overdosage: If you think you have taken too much of this medicine contact a poison control center or emergency room at once. NOTE: This medicine is only for you. Do not share this medicine with others. What if I miss a dose? If you miss a dose, take it as soon as you can. If it is almost time for your next dose, take only that dose. Do not take double or extra doses. What may interact with this medicine? Do not take this medicine with any of the following medications: -estrogens, like hormone replacement therapy or birth control pills This medicine may also interact with the following medications: -dietary supplements such as androstenedione or DHEA -prasterone -tamoxifen This list may not describe all possible interactions. Give your health care provider a list of all the medicines, herbs, non-prescription drugs, or dietary supplements you use. Also tell them if you smoke, drink alcohol, or use illegal drugs. Some items may interact with your medicine. What should I watch for while using this medicine? Visit your doctor or health care professional for regular check-ups to monitor your condition. Do not use this drug if you are pregnant. Serious side effects to an unborn child are possible. Talk to your doctor or pharmacist for more information.  You may get drowsy or dizzy. Do not drive, use machinery, or do anything that needs mental alertness until you know how this medicine affects you. Do not stand or sit up quickly, especially if you are an older patient. This reduces the risk of dizzy or fainting spells. What side effects may I notice from receiving this medicine? Side effects that you  should report to your doctor or health care professional as soon as possible: -allergic reactions like skin rash, itching, or hives -bone fracture -chest pain -difficulty breathing or shortness of breath -severe pain, swelling, warmth in the leg -unusually weak or tired -vaginal bleeding Side effects that usually do not require medical attention (report to your doctor or health care professional if they continue or are bothersome): -bone, back, joint, or muscle pain -dizziness -fatigue -fluid retention -headache -hot flashes, night sweats -nausea -weight gain This list may not describe all possible side effects. Call your doctor for medical advice about side effects. You may report side effects to FDA at 1-800-FDA-1088. Where should I keep my medicine? Keep out of the reach of children. Store between 15 and 30 degrees C (59 and 86 degrees F). Throw away any unused medicine after the expiration date. NOTE: This sheet is a summary. It may not cover all possible information. If you have questions about this medicine, talk to your doctor, pharmacist, or health care provider.  2013, [employer] Standard. (08/01/2007 4:43:44 PM) Abnormal Uterine Bleeding Abnormal uterine bleeding can have many causes. Some cases are simply treated, while others are more serious. There are several kinds of bleeding that is considered abnormal, including:  Bleeding between periods.  Bleeding after sexual intercourse.  Spotting anytime in the menstrual cycle.  Bleeding heavier or more than normal.  Bleeding after menopause. CAUSES  There are many causes of abnormal uterine bleeding. It can be present in teenagers, pregnant women, women during their reproductive years, and women who have reached menopause. Your caregiver will look for the more common causes depending on your age, signs, symptoms and your particular circumstance. Most cases are not serious and can be treated. Even the more serious causes,  like cancer of the female organs, can be treated adequately if found in the early stages. That is why all types of bleeding should be evaluated and treated as soon as possible. DIAGNOSIS  Diagnosing the cause may take several kinds of tests. Your caregiver may:  Take a complete history of the type of bleeding.  Perform a complete physical exam and Pap smear.  Take an ultrasound on the abdomen showing a picture of the female organs and the pelvis.  Inject dye into the uterus and Fallopian tubes and X-ray them (hysterosalpingogram).  Place fluid in the uterus and do an ultrasound (sonohysterogrqphy).  Take a CT scan to examine the female organs and pelvis.  Take an MRI to examine the female organs and pelvis. There is no X-ray involved with this procedure.  Look inside the uterus with a telescope that has a light at the end (hysteroscopy).  Scrap the inside of the uterus to get tissue to examine (Dilatation and Curettage, D&C).  Look into the pelvis with a telescope that has a light at the end (laparoscopy). This is done through a very small cut (incision) in the abdomen. TREATMENT  Treatment will depend on the cause of the abnormal bleeding. It can include:  Doing nothing to allow the problem to take care of itself over time.  Hormone treatment.  Birth control pills.  Treating the medical condition causing the problem.  Laparoscopy.  Major or minor surgery  Destroying the lining of the uterus with electrical currant, laser, freezing or heat (uterine ablation). HOME CARE INSTRUCTIONS   Follow your caregiver's recommendation on how to treat your problem.  See your caregiver if you missed a menstrual period and think you may be pregnant.  If you are bleeding heavily, count the number of pads/tampons you use and how often you have to change them. Tell this to your caregiver.  Avoid sexual intercourse until the problem is controlled. SEEK MEDICAL CARE IF:   You have any  kind of abnormal bleeding mentioned above.  You feel dizzy at times.  You are 27 years old and have not had a menstrual period yet. SEEK IMMEDIATE MEDICAL CARE IF:   You pass out.  You are changing pads/tampons every 15 to 30 minutes.  You have belly (abdominal) pain.  You have a temperature of 100 F (37.8 C) or higher.  You become sweaty or weak.  You are passing large blood clots from the vagina.  You start to feel sick to your stomach (nauseous) and throw up (vomit). Document Released: 05/21/2005 Document Revised: 08/13/2011 Document Reviewed: 10/14/2008 [facility] Patient Information 2014 [place], [place].

## 2013-03-12 ENCOUNTER — Encounter: Payer: Self-pay | Admitting: Obstetrics & Gynecology

## 2013-03-16 ENCOUNTER — Encounter: Payer: BC Managed Care – PPO | Admitting: Obstetrics & Gynecology

## 2013-03-19 ENCOUNTER — Encounter: Payer: Self-pay | Admitting: Obstetrics & Gynecology

## 2013-03-23 ENCOUNTER — Encounter: Payer: Self-pay | Admitting: Obstetrics & Gynecology

## 2013-03-23 ENCOUNTER — Ambulatory Visit (INDEPENDENT_AMBULATORY_CARE_PROVIDER_SITE_OTHER): Payer: BC Managed Care – PPO | Admitting: Obstetrics & Gynecology

## 2013-03-23 VITALS — BP 123/87 | HR 75 | Temp 98.0°F | Ht 61.0 in | Wt 194.0 lb

## 2013-03-23 DIAGNOSIS — N926 Irregular menstruation, unspecified: Secondary | ICD-10-CM

## 2013-03-23 DIAGNOSIS — N939 Abnormal uterine and vaginal bleeding, unspecified: Secondary | ICD-10-CM

## 2013-03-23 NOTE — Progress Notes (Signed)
Subjective:     Samantha Lee is a 27 y.o. female here for a routine exam.  Current complaints: post op follow up. Pt states her bleeding has slowed down. Pt states it seems as if it is going to stop. Pt states the provera did not help with her bleeding.   Personal health questionnaire reviewed: yes.   Gynecologic History Patient's last menstrual period was 02/28/2013. Contraception: OCP (estrogen/progesterone)   Obstetric History OB History  Gravida Para Term Preterm AB SAB TAB Ectopic Multiple Living  3 2 2  1 1         # Outcome Date GA Lbr Len/2nd Weight Sex Delivery Anes PTL Lv  3 TRM 08/18/07    F LTCS     2 TRM 07/21/03    F LTCS     1 SAB                The following portions of the patient's history were reviewed and updated as appropriate: allergies, current medications, past family history, past medical history, past social history, past surgical history and problem list.  Review of Systems Pertinent items are noted in HPI.    Objective:     No exam today.     Assessment:    Prolonged bleeding episode resolved   Plan:   Continue COCP Return prn or in 3 mths

## 2013-03-24 ENCOUNTER — Encounter: Payer: Self-pay | Admitting: *Deleted

## 2013-03-24 ENCOUNTER — Encounter: Payer: Self-pay | Admitting: Obstetrics & Gynecology

## 2013-03-24 LAB — GC/CHLAMYDIA PROBE AMP: GC Probe RNA: NEGATIVE

## 2013-03-25 ENCOUNTER — Encounter: Payer: Self-pay | Admitting: Obstetrics & Gynecology

## 2013-03-25 ENCOUNTER — Encounter: Payer: BC Managed Care – PPO | Admitting: Obstetrics & Gynecology

## 2013-04-16 NOTE — Telephone Encounter (Signed)
Error

## 2013-06-24 ENCOUNTER — Ambulatory Visit: Payer: BC Managed Care – PPO | Admitting: Obstetrics & Gynecology

## 2013-07-13 ENCOUNTER — Encounter: Payer: Self-pay | Admitting: Obstetrics & Gynecology

## 2013-07-13 ENCOUNTER — Other Ambulatory Visit: Payer: Self-pay | Admitting: *Deleted

## 2013-07-13 ENCOUNTER — Ambulatory Visit (INDEPENDENT_AMBULATORY_CARE_PROVIDER_SITE_OTHER): Payer: 59 | Admitting: Obstetrics & Gynecology

## 2013-07-13 VITALS — BP 123/83 | HR 82 | Temp 99.4°F | Ht 61.0 in | Wt 203.0 lb

## 2013-07-13 DIAGNOSIS — N9489 Other specified conditions associated with female genital organs and menstrual cycle: Secondary | ICD-10-CM

## 2013-07-13 DIAGNOSIS — N926 Irregular menstruation, unspecified: Secondary | ICD-10-CM

## 2013-07-13 DIAGNOSIS — N912 Amenorrhea, unspecified: Secondary | ICD-10-CM

## 2013-07-13 DIAGNOSIS — N939 Abnormal uterine and vaginal bleeding, unspecified: Principal | ICD-10-CM

## 2013-07-13 LAB — POCT URINE PREGNANCY: PREG TEST UR: NEGATIVE

## 2013-07-13 MED ORDER — NORETHIN ACE-ETH ESTRAD-FE 1-20 MG-MCG(24) PO TABS: 1.0000 | ORAL_TABLET | Freq: Every day | ORAL | Status: DC

## 2013-07-13 NOTE — Progress Notes (Signed)
Subjective:     Samantha Lee is a 28 y.o. female here for a routine exam.  Current complaints: follow up to abnormal uterine bleeding. Patient states she has stopped bleeding. Patient states the bleeding lasted 30 days. Patient states has had a period since then. Patient is wanting to conceive.   Personal health questionnaire reviewed: yes.   Gynecologic History Patient's last menstrual period was 03/24/2013. Contraception: none  Obstetric History OB History  Gravida Para Term Preterm AB SAB TAB Ectopic Multiple Living  3 2 2  1 1         # Outcome Date GA Lbr Len/2nd Weight Sex Delivery Anes PTL Lv  3 TRM 08/18/07    F LTCS     2 TRM 07/21/03    F LTCS     1 SAB                The following portions of the patient's history were reviewed and updated as appropriate: allergies, current medications, past family history, past medical history, past social history, past surgical history and problem list.  Review of Systems Pertinent items are noted in HPI.   Objective:   Abdomen: soft, non-tender; bowel sounds normal; no masses,  no organomegaly Pelvic: cervix normal in appearance, external genitalia normal, no adnexal masses or tenderness, no cervical motion tenderness, rectovaginal septum normal, uterus normal size, shape, and consistency, vagina normal without discharge and left ovarain cyst   Informal pelvic U/S--complex, left adnexal mass Assessment:   Left ovarain cyst amenorrhea PCOS  Plan:   Contraception: OCP (estrogen/progesterone). Loestrin  PCOS-PANEL FSH , TSH, ESTRADIOL Pelvic ultrasound Return after the U/S

## 2013-07-14 LAB — TSH: TSH: 1.09 u[IU]/mL (ref 0.350–4.500)

## 2013-07-14 LAB — TESTOSTERONE, FREE, TOTAL, SHBG
SEX HORMONE BINDING: 50 nmol/L (ref 18–114)
TESTOSTERONE FREE: 8.5 pg/mL — AB (ref 0.6–6.8)
TESTOSTERONE: 61 ng/dL (ref 10–70)
Testosterone-% Free: 1.4 % (ref 0.4–2.4)

## 2013-07-14 LAB — PROLACTIN: PROLACTIN: 12.6 ng/mL

## 2013-07-14 LAB — PROGESTERONE: Progesterone: 3.9 ng/mL

## 2013-07-14 LAB — FOLLICLE STIMULATING HORMONE: FSH: 3.6 m[IU]/mL

## 2013-07-14 LAB — ESTRADIOL: Estradiol: 275.3 pg/mL

## 2013-07-14 NOTE — Patient Instructions (Signed)
Secondary Amenorrhea   Secondary amenorrhea is the stopping of menstrual flow for 3 6 months in a female who has previously had periods. There are many possible causes. Most of these causes are not serious. Usually, treating the underlying problem causing the loss of menses will return your periods to normal.  CAUSES   Some common and uncommon causes of not menstruating include:   Malnutrition.   Low blood sugar (hypoglycemia).   Polycystic ovary disease.   Stress or fear.   Breastfeeding.   Hormone imbalance.   Ovarian failure.   Medicines.   Extreme obesity.   Cystic fibrosis.   Low body weight or drastic weight reduction from any cause.   Early menopause.   Removal of ovaries or uterus.   Contraceptives.   Illness.   Long-term (chronic) illnesses.   Cushing syndrome.   Thyroid problems.   Birth control pills, patches, or vaginal rings for birth control.  RISK FACTORS  You may be at greater risk of secondary amenorrhea if:   You have a family history of this condition.   You have an eating disorder.   You do athletic training.  DIAGNOSIS   A diagnosis is made by your health care provider taking a medical history and doing a physical exam. This will include a pelvic exam to check for problems with your reproductive organs. Pregnancy must be ruled out. Often, numerous blood tests are done to measure different hormones in the body. Urine testing may be done. Specialized exams (ultrasound, CT scan, MRI, or hysteroscopy) may have to be done as well as measuring the body mass index (BMI).  TREATMENT   Treatment depends on the cause of the amenorrhea. If an eating disorder is present, this can be treated with an adequate diet and therapy. Chronic illnesses may improve with treatment of the illness. Amenorrhea may be corrected with medicines, lifestyle changes, or surgery. If the amenorrhea cannot be corrected, it is sometimes possible to create a false menstruation with medicines.  HOME CARE  INSTRUCTIONS   Maintain a healthy diet.   Manage weight problems.   Exercise regularly but not excessively.   Get adequate sleep.   Manage stress.   Be aware of changes in your menstrual cycle. Keep a record of when your periods occur. Note the date your period starts, how long it lasts, and any problems.  SEEK MEDICAL CARE IF:  Your symptoms do not get better with treatment.  Document Released: 07/02/2006 Document Revised: 01/21/2013 Document Reviewed: 11/06/2012  [employer] Patient Information 2014 [employer].

## 2013-07-17 LAB — 17-HYDROXYPROGESTERONE: 17-OH-Progesterone, LC/MS/MS: 211 ng/dL

## 2013-07-20 ENCOUNTER — Ambulatory Visit (HOSPITAL_COMMUNITY)
Admission: RE | Admit: 2013-07-20 | Discharge: 2013-07-20 | Disposition: A | Discharge status: Home / Self Care | Payer: 59 | Source: Ambulatory Visit | Attending: Obstetrics & Gynecology | Admitting: Obstetrics & Gynecology

## 2013-07-20 DIAGNOSIS — N939 Abnormal uterine and vaginal bleeding, unspecified: Principal | ICD-10-CM

## 2013-07-20 DIAGNOSIS — N926 Irregular menstruation, unspecified: Secondary | ICD-10-CM

## 2013-07-20 DIAGNOSIS — N949 Unspecified condition associated with female genital organs and menstrual cycle: Secondary | ICD-10-CM | POA: Insufficient documentation

## 2013-07-20 DIAGNOSIS — N854 Malposition of uterus: Secondary | ICD-10-CM | POA: Insufficient documentation

## 2013-07-20 DIAGNOSIS — D259 Leiomyoma of uterus, unspecified: Secondary | ICD-10-CM | POA: Insufficient documentation

## 2013-07-20 DIAGNOSIS — N938 Other specified abnormal uterine and vaginal bleeding: Secondary | ICD-10-CM | POA: Insufficient documentation

## 2013-07-30 ENCOUNTER — Ambulatory Visit: Payer: 59 | Admitting: Obstetrics & Gynecology

## 2013-08-05 ENCOUNTER — Ambulatory Visit (INDEPENDENT_AMBULATORY_CARE_PROVIDER_SITE_OTHER): Payer: 59 | Admitting: Obstetrics & Gynecology

## 2013-08-05 ENCOUNTER — Encounter: Payer: Self-pay | Admitting: Obstetrics & Gynecology

## 2013-08-05 VITALS — BP 119/83 | HR 90 | Temp 97.8°F | Ht 61.0 in | Wt 200.0 lb

## 2013-08-05 DIAGNOSIS — N831 Corpus luteum cyst of ovary, unspecified side: Secondary | ICD-10-CM

## 2013-08-05 NOTE — Progress Notes (Signed)
Subjective:     Samantha Lee is a 28 y.o. female here for a routine exam.  Current complaints: Patient is in the office today for a follow up visit of ultrasound results. Patient states that when her cycle finally came on that the cramping pain was horrible and that it was heavy bleeding. Patient states that cycle lasted for 8 days. Patient states cycles are usually about 4-5 days and that her cycles are normally not heavy.  Patient states that prior to her ultrasound she was having abdominal pain like a heaviness.   Personal health questionnaire reviewed: yes.   Gynecologic History Patient's last menstrual period was 07/26/2013. Contraception: OCP (estrogen/progesterone)  Obstetric History OB History  Gravida Para Term Preterm AB SAB TAB Ectopic Multiple Living  3 2 2  1 1         # Outcome Date GA Lbr Len/2nd Weight Sex Delivery Anes PTL Lv  3 TRM 08/18/07    F LTCS     2 TRM 07/21/03    F LTCS     1 SAB                The following portions of the patient's history were reviewed and updated as appropriate: allergies, current medications, past family history, past medical history, past social history, past surgical history and problem list.  Review of Systems Pertinent items are noted in HPI.    Objective:     No exam today     Assessment:   Resolving CLC  Plan:    Return prn

## 2013-08-09 NOTE — Patient Instructions (Signed)

## 2013-08-12 ENCOUNTER — Encounter: Payer: Self-pay | Admitting: Obstetrics & Gynecology

## 2013-08-17 ENCOUNTER — Encounter: Payer: Self-pay | Admitting: Obstetrics & Gynecology

## 2013-11-05 ENCOUNTER — Ambulatory Visit (INDEPENDENT_AMBULATORY_CARE_PROVIDER_SITE_OTHER): Payer: 59 | Admitting: Obstetrics & Gynecology

## 2013-11-05 VITALS — BP 123/73 | HR 90 | Temp 98.6°F | Wt 193.0 lb

## 2013-11-05 DIAGNOSIS — Z3201 Encounter for pregnancy test, result positive: Secondary | ICD-10-CM

## 2013-11-06 LAB — OBSTETRIC PANEL
ANTIBODY SCREEN: NEGATIVE
BASOS ABS: 0 10*3/uL (ref 0.0–0.1)
BASOS PCT: 0 % (ref 0–1)
EOS PCT: 1 % (ref 0–5)
Eosinophils Absolute: 0.1 10*3/uL (ref 0.0–0.7)
HCT: 36.9 % (ref 36.0–46.0)
Hemoglobin: 12.7 g/dL (ref 12.0–15.0)
Hepatitis B Surface Ag: NEGATIVE
Lymphocytes Relative: 30 % (ref 12–46)
Lymphs Abs: 3.1 10*3/uL (ref 0.7–4.0)
MCH: 30.2 pg (ref 26.0–34.0)
MCHC: 34.4 g/dL (ref 30.0–36.0)
MCV: 87.9 fL (ref 78.0–100.0)
Monocytes Absolute: 0.7 10*3/uL (ref 0.1–1.0)
Monocytes Relative: 7 % (ref 3–12)
NEUTROS ABS: 6.4 10*3/uL (ref 1.7–7.7)
Neutrophils Relative %: 62 % (ref 43–77)
Platelets: 297 10*3/uL (ref 150–400)
RBC: 4.2 MIL/uL (ref 3.87–5.11)
RDW: 13.4 % (ref 11.5–15.5)
Rh Type: POSITIVE
Rubella: 1.99 Index — ABNORMAL HIGH (ref <0.90)
WBC: 10.3 10*3/uL (ref 4.0–10.5)

## 2013-11-06 LAB — HEMOGLOBIN A1C
HEMOGLOBIN A1C: 5.4 % (ref <5.7)
MEAN PLASMA GLUCOSE: 108 mg/dL (ref <117)

## 2013-11-06 LAB — HIV ANTIBODY (ROUTINE TESTING W REFLEX): HIV 1&2 Ab, 4th Generation: NONREACTIVE

## 2013-11-06 LAB — VARICELLA ZOSTER ANTIBODY, IGG: VARICELLA IGG: 817.4 {index} — AB (ref <135.00)

## 2013-11-06 LAB — VITAMIN D 25 HYDROXY (VIT D DEFICIENCY, FRACTURES): VIT D 25 HYDROXY: 22 ng/mL — AB (ref 30–89)

## 2013-11-06 LAB — HCG, QUANTITATIVE, PREGNANCY: hCG, Beta Chain, Quant, S: 87286.4 m[IU]/mL

## 2013-11-07 LAB — CULTURE, OB URINE: Colony Count: 40000

## 2013-11-09 ENCOUNTER — Telehealth: Payer: Self-pay | Admitting: *Deleted

## 2013-11-09 LAB — HEMOGLOBINOPATHY EVALUATION
HGB A2 QUANT: 2.6 % (ref 2.2–3.2)
HGB F QUANT: 0 % (ref 0.0–2.0)
Hemoglobin Other: 0 %
Hgb A: 97.4 % (ref 96.8–97.8)
Hgb S Quant: 0 %

## 2013-11-09 NOTE — Telephone Encounter (Signed)
Patient called about her lab results. 7:57 LM on VM to CB.

## 2013-11-10 ENCOUNTER — Encounter: Payer: Self-pay | Admitting: Obstetrics & Gynecology

## 2013-11-10 NOTE — Patient Instructions (Signed)
Prenatal Care  °WHAT IS PRENATAL CARE?  °Prenatal care means health care during your pregnancy, before your baby is born. It is very important to take care of yourself and your baby during your pregnancy by:  °· Getting early prenatal care. If you know you are pregnant, or think you might be pregnant, call your health care provider as soon as possible. Schedule a visit for a prenatal exam. °· Getting regular prenatal care. Follow your health care provider's schedule for blood and other necessary tests. Do not miss appointments. °· Doing everything you can to keep yourself and your baby healthy during your pregnancy. °· Getting complete care. Prenatal care should include evaluation of the medical, dietary, educational, psychological, and social needs of you and your significant other. The medical and genetic history of your family and the family of your baby's father should be discussed with your health care provider. °· Discussing with your health care provider: °· Prescription, over-the-counter, and herbal medicines that you take. °· Any history of substance abuse, alcohol use, smoking, and illegal drug use. °· Any history of domestic abuse and violence. °· Immunizations you have received. °· Your nutrition and diet. °· The amount of exercise you do. °· Any environmental and occupational hazards to which you are exposed. °· History of sexually transmitted infections for both you and your partner. °· Previous pregnancies you have had. °WHY IS PRENATAL CARE SO IMPORTANT?  °By regularly seeing your health care provider, you help ensure that problems can be identified early so that they can be treated as soon as possible. Other problems might be prevented. Many studies have shown that early and regular prenatal care is important for the health of mothers and their babies.  °HOW CAN I TAKE CARE OF MYSELF WHILE I AM PREGNANT?  °Here are ways to take care of yourself and your baby:  °· Start or continue taking your  multivitamin with 400 micrograms (mcg) of folic acid every day. °· Get early and regular prenatal care. It is very important to see a health care provider during your pregnancy. Your health care provider will check at each visit to make sure that you and the baby are healthy. If there are any problems, action can be taken right away to help you and the baby. °· Eat a healthy diet that includes: °· Fruits. °· Vegetables. °· Foods low in saturated fat. °· Whole grains. °· Calcium-rich foods, such as milk, yogurt, and hard cheeses. °· Drink 6 to 8 glasses of liquids a day. °· Unless your health care provider tells you not to, try to be physically active for 30 minutes, most days of the week. If you are pressed for time, you can get your activity in through 10-minute segments, three times a day. °· Do not smoke, drink alcohol, or use drugs. These can cause long-term damage to your baby. Talk with your health care provider about steps to take to stop smoking. Talk with a member of your faith community, a counselor, a trusted friend, or your health care provider if you are concerned about your alcohol or drug use. °· Ask your health care provider before taking any medicine, even over-the-counter medicines. Some medicines are not safe to take during pregnancy. °· Get plenty of rest and sleep. °· Avoid hot tubs and saunas during pregnancy. °· Do not have X-rays taken unless absolutely necessary and with the recommendation of your health care provider. A lead shield can be placed on your abdomen to protect the   baby when X-rays are taken in other parts of the body. °· Do not empty the cat litter when you are pregnant. It may contain a parasite that causes an infection called toxoplasmosis, which can cause birth defects. Also, use gloves when working in garden areas used by cats. °· Do not eat uncooked or undercooked meats or fish. °· Do not eat soft, mold-ripened cheeses (Brie, Camembert, and chevre) or soft, blue-veined  cheese (Danish blue and Roquefort). °· Stay away from toxic chemicals like: °· Insecticides. °· Solvents (some cleaners or paint thinners). °· Lead. °· Mercury. °· Sexual intercourse may continue until the end of the pregnancy, unless you have a medical problem or there is a problem with the pregnancy and your health care provider tells you not to. °· Do not wear high-heel shoes, especially during the second half of the pregnancy. You can lose your balance and fall. °· Do not take long trips, unless absolutely necessary. Be sure to see your health care provider before going on the trip. °· Do not sit in one position for more than 2 hours when on a trip. °· Take a copy of your medical records when going on a trip. Know where a hospital is located in the city you are visiting, in case of an emergency. °· Most dangerous household products will have pregnancy warnings on their labels. Ask your health care provider about products if you are unsure. °· Limit or eliminate your caffeine intake from coffee, tea, sodas, medicines, and chocolate. °· Many women continue working through pregnancy. Staying active might help you stay healthier. If you have a question about the safety or the hours you work at your particular job, talk with your health care provider. °· Get informed: °· Read books. °· Watch videos. °· Go to childbirth classes for you and your significant other. °· Talk with experienced moms. °· Ask your health care provider about childbirth education classes for you and your partner. Classes can help you and your partner prepare for the birth of your baby. °· Ask about a baby doctor (pediatrician) and methods and pain medicine for labor, delivery, and possible cesarean delivery. °HOW OFTEN SHOULD I SEE MY HEALTH CARE PROVIDER DURING PREGNANCY?  °Your health care provider will give you a schedule for your prenatal visits. You will have visits more often as you get closer to the end of your pregnancy. An average  pregnancy lasts about 40 weeks.  °A typical schedule includes visiting your health care provider:  °· About once each month during your first 6 months of pregnancy. °· Every 2 weeks during the next 2 months. °· Weekly in the last month, until the delivery date. °Your health care provider will probably want to see you more often if: °· You are older than 35 years. °· Your pregnancy is high risk because you have certain health problems or problems with the pregnancy, such as: °· Diabetes. °· High blood pressure. °· The baby is not growing on schedule, according to the dates of the pregnancy. °Your health care provider will do special tests to make sure you and the baby are not having any serious problems. °WHAT HAPPENS DURING PRENATAL VISITS?  °· At your first prenatal visit, your health care provider will do a physical exam and talk to you about your health history and the health history of your partner and your family. Your health care provider will be able to tell you what date to expect your baby to be born on. °·   Your first physical exam will include checks of your blood pressure, measurements of your height and weight, and an exam of your pelvic organs. Your health care provider will do a Pap test if you have not had one recently and will do cultures of your cervix to make sure there is no infection. °· At each prenatal visit, there will be tests of your blood, urine, blood pressure, weight, and checking the progress of the baby. °· At your later prenatal visits, your health care provider will check how you are doing and how the baby is developing. You may have a number of tests done as your pregnancy progresses. °· Ultrasound exams are often used to check on the baby's growth and health. °· You may have more urine and blood tests, as well as special tests, if needed. These may include amniocentesis to examine fluid in the pregnancy sac, stress tests to check how the baby responds to contractions, or a  biophysical profile to measure fetus well-being. Your health care provider will explain the tests and why they are necessary. °· You should discuss with your health care provider your plans to breastfeed or bottle-feed your baby. °· Each visit is also a chance for you to learn about staying healthy during pregnancy and to ask questions. °Document Released: 05/24/2003 Document Revised: 03/11/2013 Document Reviewed: 11/06/2012 °[employer]® Patient Information ©2014 [employer]. ° °

## 2013-11-10 NOTE — Progress Notes (Signed)
Patient ID: Samantha Lee, female   DOB: [birth year], 28 y.o.   MRN: [id number]  Chief Complaint  Patient presents with  . Follow-up    HPI Samantha Lee is a 28 y.o. female.  H/O endometriosis/chronic pelvic pain.  HPI  Past Medical History  Diagnosis Date  . Headache(784.0)     otc med prn  . Endometriosis     Past Surgical History  Procedure Laterality Date  . Cesarean section      x 2  . Dilation and curettage of uterus      mab  . Laparoscopy N/A 01/15/2013    Procedure: LAPAROSCOPY DIAGNOSTIC  ;  Surgeon: Lahoma Crocker, MD;  Location: [facility];  Service: Gynecology;  Laterality: N/A;  . Laparoscopic lysis of adhesions N/A 01/15/2013    Procedure: LAPAROSCOPIC LYSIS OF ADHESIONS;  Surgeon: Lahoma Crocker, MD;  Location: [facility];  Service: Gynecology;  Laterality: N/A;  with ablation of endometriotic implants   . Laparoscopy Right 01/15/2013    Procedure: LAPAROSCOPY OPERATIVE  ;  Surgeon: Lahoma Crocker, MD;  Location: [facility];  Service: Gynecology;  Laterality: Right;  R sidewall biopsy    Family History  Problem Relation Age of Onset  . Hypertension Mother     Social History History  Substance Use Topics  . Smoking status: Never Smoker   . Smokeless tobacco: Never Used  . Alcohol Use: No    No Known Allergies  Current Outpatient Prescriptions  Medication Sig Dispense Refill  . Norethindrone Acetate-Ethinyl Estrad-FE (LOESTRIN 24 FE) 1-20 MG-MCG(24) tablet Take 1 tablet by mouth daily.  1 Package  11  . traMADol (ULTRAM) 50 MG tablet Take 50 mg by mouth every 6 (six) hours as needed for pain.       No current facility-administered medications for this visit.    Review of Systems Review of Systems Constitutional: negative for fatigue and weight loss Respiratory: negative for cough and wheezing Cardiovascular: negative for chest pain, fatigue and palpitations Gastrointestinal: negative for abdominal pain and change in bowel  habits Genitourinary:negative for vaginal discharge Integument/breast: negative for nipple discharge Musculoskeletal:negative for myalgias Neurological: negative for gait problems and tremors Behavioral/Psych: negative for abusive relationship, depression Endocrine: negative for temperature intolerance     Blood pressure 123/73, pulse 90, temperature 98.6 F (37 C), weight 87.544 kg (193 lb), last menstrual period 10/11/2013.  Physical Exam Physical Exam   50% of 15 min visit spent on counseling and coordination of care.   Data Reviewed UPT  Assessment    Early pregnant    Plan    Orders Placed This Encounter  Procedures  . Culture, OB Urine  . Obstetric panel  . HIV antibody  . Hemoglobinopathy evaluation  . Vit D  25 hydroxy (rtn osteoporosis monitoring)  . Varicella zoster antibody, IgG  . Hemoglobin A1c  . hCG, quantitative, pregnancy   Early U/S for dating Start prenatal care at next visit Follow up as needed.         Lahoma Crocker 11/10/2013, 8:19 AM

## 2013-11-10 NOTE — Progress Notes (Signed)
Quick Note:  Needs PNV w/vitamin D ______

## 2013-11-10 NOTE — Telephone Encounter (Signed)
Patient called back for lab results-reviewed lab results with patient. Patient LMP 09/11/2013 with EDD 06/18/2014. Patient is doing well with just slight cramping at times. Told patient would send message to dr Delsa Sale and ask when she wants to see her and when she wants her first ultrasound.

## 2013-11-14 ENCOUNTER — Inpatient Hospital Stay (HOSPITAL_COMMUNITY)
Admission: AD | Admit: 2013-11-14 | Discharge: 2013-11-14 | Disposition: A | Discharge status: Home / Self Care | Payer: 59 | Source: Ambulatory Visit | Attending: Obstetrics & Gynecology | Admitting: Obstetrics & Gynecology

## 2013-11-14 ENCOUNTER — Inpatient Hospital Stay (HOSPITAL_COMMUNITY): Payer: 59

## 2013-11-14 ENCOUNTER — Encounter (HOSPITAL_COMMUNITY): Payer: Self-pay | Admitting: *Deleted

## 2013-11-14 DIAGNOSIS — Z8249 Family history of ischemic heart disease and other diseases of the circulatory system: Secondary | ICD-10-CM | POA: Insufficient documentation

## 2013-11-14 DIAGNOSIS — O209 Hemorrhage in early pregnancy, unspecified: Secondary | ICD-10-CM | POA: Insufficient documentation

## 2013-11-14 LAB — URINE MICROSCOPIC-ADD ON

## 2013-11-14 LAB — URINALYSIS, ROUTINE W REFLEX MICROSCOPIC
BILIRUBIN URINE: NEGATIVE
Glucose, UA: NEGATIVE mg/dL
KETONES UR: NEGATIVE mg/dL
Leukocytes, UA: NEGATIVE
NITRITE: NEGATIVE
Protein, ur: NEGATIVE mg/dL
Specific Gravity, Urine: 1.015 (ref 1.005–1.030)
UROBILINOGEN UA: 0.2 mg/dL (ref 0.0–1.0)
pH: 6.5 (ref 5.0–8.0)

## 2013-11-14 LAB — WET PREP, GENITAL
Clue Cells Wet Prep HPF POC: NONE SEEN
Trich, Wet Prep: NONE SEEN
YEAST WET PREP: NONE SEEN

## 2013-11-14 NOTE — MAU Note (Signed)
Patient presents with complaint of vaginal bleeding this am.

## 2013-11-14 NOTE — MAU Provider Note (Signed)
History     CSN: [id number]  Arrival date and time: 11/14/13 1009   None     Chief Complaint  Patient presents with  . Vaginal Bleeding   HPI 28 y.o. [id number] at [redacted]w[redacted]d with vaginal bleeding starting this morning. Bleeding like a period upon getting up this morning, has decreased some since then. No pain. Blood type O pos.   Past Medical History  Diagnosis Date  . Headache(784.0)     otc med prn  . Endometriosis     Past Surgical History  Procedure Laterality Date  . Cesarean section      x 2  . Dilation and curettage of uterus      mab  . Laparoscopy N/A 01/15/2013    Procedure: LAPAROSCOPY DIAGNOSTIC  ;  Surgeon: Lahoma Crocker, MD;  Location: [facility];  Service: Gynecology;  Laterality: N/A;  . Laparoscopic lysis of adhesions N/A 01/15/2013    Procedure: LAPAROSCOPIC LYSIS OF ADHESIONS;  Surgeon: Lahoma Crocker, MD;  Location: [facility];  Service: Gynecology;  Laterality: N/A;  with ablation of endometriotic implants   . Laparoscopy Right 01/15/2013    Procedure: LAPAROSCOPY OPERATIVE  ;  Surgeon: Lahoma Crocker, MD;  Location: [facility];  Service: Gynecology;  Laterality: Right;  R sidewall biopsy    Family History  Problem Relation Age of Onset  . Hypertension Mother     History  Substance Use Topics  . Smoking status: Never Smoker   . Smokeless tobacco: Never Used  . Alcohol Use: No    Allergies: No Known Allergies  No prescriptions prior to admission    Review of Systems  Constitutional: Negative.   Respiratory: Negative.   Cardiovascular: Negative.   Gastrointestinal: Negative for nausea, vomiting, abdominal pain, diarrhea and constipation.  Genitourinary: Negative for dysuria, urgency, frequency, hematuria and flank pain.       Positive vaginal bleeding  Musculoskeletal: Negative.   Neurological: Negative.   Psychiatric/Behavioral: Negative.    Physical Exam   Blood pressure 114/67, pulse 76, temperature 98.4 F (36.9 C), temperature source  Oral, resp. rate 18, height 5\' 1"  (1.549 m), weight 194 lb 6 oz (88.168 kg), last menstrual period 09/11/2013.  Physical Exam  Nursing note and vitals reviewed. Constitutional: She is oriented to person, place, and time. She appears well-developed and well-nourished. No distress.  HENT:  Head: Normocephalic and atraumatic.  Cardiovascular: Normal rate.   Respiratory: Effort normal.  GI: Soft. Bowel sounds are normal. She exhibits no mass. There is no tenderness. There is no rebound and no guarding.  Genitourinary: There is no rash or lesion on the right labia. There is no rash or lesion on the left labia. Uterus is not tender. Enlarged: Size c/w dates. Cervix exhibits no motion tenderness, no discharge and no friability. Right adnexum displays no mass, no tenderness and no fullness. Left adnexum displays no mass, no tenderness and no fullness. There is bleeding around the vagina. No tenderness around the vagina. Foreign body: small. No vaginal discharge found.  Musculoskeletal: Normal range of motion.  Neurological: She is alert and oriented to person, place, and time.  Skin: Skin is warm and dry.  Psychiatric: She has a normal mood and affect.    MAU Course  Procedures  Results for orders placed during the hospital encounter of 11/14/13 (from the past 24 hour(s))  URINALYSIS, ROUTINE W REFLEX MICROSCOPIC     Status: Abnormal   Collection Time    11/14/13 10:17 AM      Result  Value Ref Range   Color, Urine YELLOW  YELLOW   APPearance CLEAR  CLEAR   Specific Gravity, Urine 1.015  1.005 - 1.030   pH 6.5  5.0 - 8.0   Glucose, UA NEGATIVE  NEGATIVE mg/dL   Hgb urine dipstick LARGE (*) NEGATIVE   Bilirubin Urine NEGATIVE  NEGATIVE   Ketones, ur NEGATIVE  NEGATIVE mg/dL   Protein, ur NEGATIVE  NEGATIVE mg/dL   Urobilinogen, UA 0.2  0.0 - 1.0 mg/dL   Nitrite NEGATIVE  NEGATIVE   Leukocytes, UA NEGATIVE  NEGATIVE  URINE MICROSCOPIC-ADD ON     Status: Abnormal   Collection Time     11/14/13 10:17 AM      Result Value Ref Range   Squamous Epithelial / LPF FEW (*) RARE   WBC, UA 0-2  <3 WBC/hpf   RBC / HPF 3-6  <3 RBC/hpf   Bacteria, UA RARE  RARE   Urine-Other MUCOUS PRESENT    WET PREP, GENITAL     Status: Abnormal   Collection Time    11/14/13 10:50 AM      Result Value Ref Range   Yeast Wet Prep HPF POC NONE SEEN  NONE SEEN   Trich, Wet Prep NONE SEEN  NONE SEEN   Clue Cells Wet Prep HPF POC NONE SEEN  NONE SEEN   WBC, Wet Prep HPF POC MODERATE (*) NONE SEEN   US Ob Comp Less 14 Wks  11/14/2013   CLINICAL DATA:  First trimester pregnancy with vaginal bleeding.  EXAM: OBSTETRIC <14 WK [place] AND TRANSVAGINAL OB US  TECHNIQUE: Both transabdominal and transvaginal ultrasound examinations were performed for complete evaluation of the gestation as well as the maternal uterus, adnexal regions, and pelvic cul-de-sac. Transvaginal technique was performed to assess early pregnancy.  COMPARISON:  None.  FINDINGS: Intrauterine gestational sac: Visualized/normal in shape.  Yolk sac:  Visualized.  Embryo:  Visualized.  Cardiac Activity: Visualized.  Heart Rate:  175 bpm  CRL:   19.2  mm   8 w 4 d                  [place] EDC: 06/22/2014  Maternal uterus/adnexae: There is a small subchorionic hematoma, measuring up to 2.7 cm in diameter. Both maternal ovaries appear normal. There is no adnexal mass or free pelvic fluid.  IMPRESSION: 1. Single live intrauterine gestation with best estimated gestational age of [redacted] weeks 4 days. 2. Small subchorionic hematoma. 3. No adnexal abnormalities identified.   Electronically Signed   By: Camie Patience M.D.   On: 11/14/2013 13:10   US Ob Transvaginal  11/14/2013   CLINICAL DATA:  First trimester pregnancy with vaginal bleeding.  EXAM: OBSTETRIC <14 WK [place] AND TRANSVAGINAL OB US  TECHNIQUE: Both transabdominal and transvaginal ultrasound examinations were performed for complete evaluation of the gestation as well as the maternal uterus, adnexal regions, and  pelvic cul-de-sac. Transvaginal technique was performed to assess early pregnancy.  COMPARISON:  None.  FINDINGS: Intrauterine gestational sac: Visualized/normal in shape.  Yolk sac:  Visualized.  Embryo:  Visualized.  Cardiac Activity: Visualized.  Heart Rate:  175 bpm  CRL:   19.2  mm   8 w 4 d                  [place] EDC: 06/22/2014  Maternal uterus/adnexae: There is a small subchorionic hematoma, measuring up to 2.7 cm in diameter. Both maternal ovaries appear normal. There is no adnexal mass or free pelvic  fluid.  IMPRESSION: 1. Single live intrauterine gestation with best estimated gestational age of [redacted] weeks 4 days. 2. Small subchorionic hematoma. 3. No adnexal abnormalities identified.   Electronically Signed   By: Camie Patience M.D.   On: 11/14/2013 13:10    Assessment and Plan   1. Vaginal bleeding in pregnant patient at less than [redacted] weeks gestation   Precautions rev'd, pelvic rest, f/u in office as scheduled or sooner PRN    Medication List         prenatal multivitamin Tabs tablet  Take 1 tablet by mouth daily at 12 noon.            Follow-up Information   Follow up with Agnes Lawrence, MD. (as scheduled or sooner as needed)    Specialty:  Obstetrics and Gynecology   Contact information:   [address] [address] [phone]         Susa Simmonds 11/14/2013, 2:24 PM

## 2013-11-14 NOTE — Discharge Instructions (Signed)
Vaginal Bleeding During Pregnancy, First Trimester °A small amount of bleeding (spotting) from the vagina is relatively common in early pregnancy. It usually stops on its own. Various things may cause bleeding or spotting in early pregnancy. Some bleeding may be related to the pregnancy, and some may not. In most cases, the bleeding is normal and is not a problem. However, bleeding can also be a sign of something serious. Be sure to tell your health care provider about any vaginal bleeding right away. °Some possible causes of vaginal bleeding during the first trimester include: °· Infection or inflammation of the cervix. °· Growths (polyps) on the cervix. °· Miscarriage or threatened miscarriage. °· Pregnancy tissue has developed outside of the uterus and in a fallopian tube (tubal pregnancy). °· Tiny cysts have developed in the uterus instead of pregnancy tissue (molar pregnancy). °HOME CARE INSTRUCTIONS  °Watch your condition for any changes. The following actions may help to lessen any discomfort you are feeling: °· Follow your health care provider's instructions for limiting your activity. If your health care provider orders bed rest, you may need to stay in bed and only get up to use the bathroom. However, your health care provider may allow you to continue light activity. °· If needed, make plans for someone to help with your regular activities and responsibilities while you are on bed rest. °· Keep track of the number of pads you use each day, how often you change pads, and how soaked (saturated) they are. Write this down. °· Do not use tampons. Do not douche. °· Do not have sexual intercourse or orgasms until approved by your health care provider. °· If you pass any tissue from your vagina, save the tissue so you can show it to your health care provider. °· Only take over-the-counter or prescription medicines as directed by your health care provider. °· Do not take aspirin because it can make you  bleed. °· Keep all follow-up appointments as directed by your health care provider. °SEEK MEDICAL CARE IF: °· You have any vaginal bleeding during any part of your pregnancy. °· You have cramps or labor pains. °SEEK IMMEDIATE MEDICAL CARE IF:  °· You have severe cramps in your back or belly (abdomen). °· You have a fever, not controlled by medicine. °· You pass large clots or tissue from your vagina. °· Your bleeding increases. °· You feel lightheaded or weak, or you have fainting episodes. °· You have chills. °· You are leaking fluid or have a gush of fluid from your vagina. °· You pass out while having a bowel movement. °MAKE SURE YOU: °· Understand these instructions. °· Will watch your condition. °· Will get help right away if you are not doing well or get worse. °Document Released: 02/28/2005 Document Revised: 03/11/2013 Document Reviewed: 01/26/2013 °Exit[employer]® Patient Information ©2014 [employer]. ° °

## 2013-11-16 LAB — GC/CHLAMYDIA PROBE AMP
CT Probe RNA: NEGATIVE
GC Probe RNA: NEGATIVE

## 2013-11-17 NOTE — Telephone Encounter (Signed)
Patient had [place] 11/14/2013- make sure Dr Velora Mediate is ok with her NOB appointment- it is probably fine, but check to make sure.

## 2013-12-07 ENCOUNTER — Ambulatory Visit (INDEPENDENT_AMBULATORY_CARE_PROVIDER_SITE_OTHER): Payer: 59 | Admitting: Obstetrics & Gynecology

## 2013-12-07 ENCOUNTER — Encounter: Payer: Self-pay | Admitting: Obstetrics & Gynecology

## 2013-12-07 VITALS — BP 123/89 | HR 77 | Temp 98.1°F | Wt 196.0 lb

## 2013-12-07 DIAGNOSIS — Z9889 Other specified postprocedural states: Secondary | ICD-10-CM

## 2013-12-07 DIAGNOSIS — Z3481 Encounter for supervision of other normal pregnancy, first trimester: Secondary | ICD-10-CM

## 2013-12-07 DIAGNOSIS — Z98891 History of uterine scar from previous surgery: Secondary | ICD-10-CM | POA: Insufficient documentation

## 2013-12-07 DIAGNOSIS — Z3480 Encounter for supervision of other normal pregnancy, unspecified trimester: Secondary | ICD-10-CM

## 2013-12-07 LAB — POCT URINALYSIS DIPSTICK
Bilirubin, UA: NEGATIVE
GLUCOSE UA: NEGATIVE
Ketones, UA: NEGATIVE
Leukocytes, UA: NEGATIVE
Nitrite, UA: NEGATIVE
Protein, UA: NEGATIVE
RBC UA: NEGATIVE
SPEC GRAV UA: 1.01
UROBILINOGEN UA: NEGATIVE
pH, UA: 7

## 2013-12-08 NOTE — Progress Notes (Signed)
Subjective:    Samantha Lee is being seen today for her first obstetrical visit.  She is at [redacted]w[redacted]d gestation. Her obstetrical history is significant for obesity. Pregnancy history fully reviewed.    Menstrual History: OB History   Grav Para Term Preterm Abortions TAB SAB Ect Mult Living   4 3 2 1  0  0   2       Patient's last menstrual period was 09/11/2013.    Past Medical History  Diagnosis Date  . Headache(784.0)     otc med prn  . Endometriosis   . Preterm labor   . Gestational diabetes     Past Surgical History  Procedure Laterality Date  . Cesarean section      x 2  . Dilation and curettage of uterus      mab  . Laparoscopy N/A 01/15/2013    Procedure: LAPAROSCOPY DIAGNOSTIC  ;  Surgeon: Lahoma Crocker, MD;  Location: [facility];  Service: Gynecology;  Laterality: N/A;  . Laparoscopic lysis of adhesions N/A 01/15/2013    Procedure: LAPAROSCOPIC LYSIS OF ADHESIONS;  Surgeon: Lahoma Crocker, MD;  Location: [facility];  Service: Gynecology;  Laterality: N/A;  with ablation of endometriotic implants   . Laparoscopy Right 01/15/2013    Procedure: LAPAROSCOPY OPERATIVE  ;  Surgeon: Lahoma Crocker, MD;  Location: [facility];  Service: Gynecology;  Laterality: Right;  R sidewall biopsy     (Not in a hospital admission) No Known Allergies  History  Substance Use Topics  . Smoking status: Never Smoker   . Smokeless tobacco: Never Used  . Alcohol Use: No    Family History  Problem Relation Age of Onset  . Hypertension Mother      Review of Systems Constitutional: negative for weight loss Gastrointestinal: negative for vomiting Genitourinary:negative for genital lesions and vaginal discharge and dysuria Musculoskeletal:negative for back pain Behavioral/Psych: negative for abusive relationship, depression, illegal drug usage and tobacco use    Objective:     General Appearance:    Alert, cooperative, no distress, appears stated age  Head:    Normocephalic,  without obvious abnormality, atraumatic  Eyes:    PERRL, conjunctiva/corneas clear, EOM's intact, fundi    benign, both eyes  Ears:    Normal TM's and external ear canals, both ears  Nose:   Nares normal, septum midline, mucosa normal, no drainage    or sinus tenderness  Throat:   Lips, mucosa, and tongue normal; teeth and gums normal  Neck:   Supple, symmetrical, trachea midline, no adenopathy;    thyroid:  no enlargement/tenderness/nodules; no carotid   bruit or JVD  Back:     Symmetric, no curvature, ROM normal, no CVA tenderness  Lungs:     Clear to auscultation bilaterally, respirations unlabored  Chest Wall:    No tenderness or deformity   Heart:    Regular rate and rhythm, S1 and S2 normal, no murmur, rub   or gallop  Breast Exam:    No tenderness, masses, or nipple abnormality  Abdomen:     Soft, non-tender, bowel sounds active all four quadrants,    no masses, no organomegaly  Genitalia:    Normal female without lesion, discharge or tenderness  Extremities:   Extremities normal, atraumatic, no cyanosis or edema  Pulses:   2+ and symmetric all extremities  Skin:   Skin color, texture, turgor normal, no rashes or lesions  Lymph nodes:   Cervical, supraclavicular, and axillary nodes normal  Neurologic:  CNII-XII intact, normal strength, sensation and reflexes    throughout      Lab Review Urine pregnancy test Labs reviewed yes Radiologic studies reviewed yes Assessment:    Pregnancy at [redacted]w[redacted]d weeks    Plan:      Prenatal vitamins.  Counseling provided regarding continued use of seat belts, cessation of alcohol consumption, smoking or use of illicit drugs; infection precautions i.e., influenza/TDAP immunizations, toxoplasmosis,CMV, parvovirus, listeria and varicella; workplace safety, exercise during pregnancy; routine dental care, safe medications, sexual activity, hot tubs, saunas, pools, travel, caffeine use, fish and methlymercury, potential toxins, hair treatments,  varicose veins Weight gain recommendations per IOM guidelines reviewed: obese/BMI >30->gain  11 - 20 lbs Problem list reviewed and updated. FIRST/CF mutation testing/NIPT/QUAD SCREEN/fragile X/Spinal muscular atrophy discussed: declined. Role of ultrasound in pregnancy discussed; fetal survey: planned  Orders Placed This Encounter  Procedures  . POCT urinalysis dipstick    Follow up in 4 weeks.

## 2013-12-09 NOTE — Patient Instructions (Signed)
Prenatal Care  WHAT IS PRENATAL CARE?  Prenatal care means health care during your pregnancy, before your baby is born. It is very important to take care of yourself and your baby during your pregnancy by:   Getting early prenatal care. If you know you are pregnant, or think you might be pregnant, call your health care provider as soon as possible. Schedule a visit for a prenatal exam.  Getting regular prenatal care. Follow your health care provider's schedule for blood and other necessary tests. Do not miss appointments.  Doing everything you can to keep yourself and your baby healthy during your pregnancy.  Getting complete care. Prenatal care should include evaluation of the medical, dietary, educational, psychological, and social needs of you and your significant other. The medical and genetic history of your family and the family of your baby's father should be discussed with your health care provider.  Discussing with your health care provider:  Prescription, over-the-counter, and herbal medicines that you take.  Any history of substance abuse, alcohol use, smoking, and illegal drug use.  Any history of domestic abuse and violence.  Immunizations you have received.  Your nutrition and diet.  The amount of exercise you do.  Any environmental and occupational hazards to which you are exposed.  History of sexually transmitted infections for both you and your partner.  Previous pregnancies you have had. WHY IS PRENATAL CARE SO IMPORTANT?  By regularly seeing your health care provider, you help ensure that problems can be identified early so that they can be treated as soon as possible. Other problems might be prevented. Many studies have shown that early and regular prenatal care is important for the health of mothers and their babies.  HOW CAN I TAKE CARE OF MYSELF WHILE I AM PREGNANT?  Here are ways to take care of yourself and your baby:   Start or continue taking your  multivitamin with 400 micrograms (mcg) of folic acid every day.  Get early and regular prenatal care. It is very important to see a health care provider during your pregnancy. Your health care provider will check at each visit to make sure that you and your baby are healthy. If there are any problems, action can be taken right away to help you and your baby.  Eat a healthy diet that includes:  Fruits.  Vegetables.  Foods low in saturated fat.  Whole grains.  Calcium-rich foods, such as milk, yogurt, and hard cheeses.  Drink 6-8 glasses of liquids a day.  Unless your health care provider tells you not to, try to be physically active for 30 minutes, most days of the week. If you are pressed for time, you can get your activity in through 10-minute segments, three times a day.  Do not smoke, drink alcohol, or use drugs. These can cause long-term damage to your baby. Talk with your health care provider about steps to take to stop smoking. Talk with a member of your faith community, a counselor, a trusted friend, or your health care provider if you are concerned about your alcohol or drug use.  Ask your health care provider before taking any medicine, even over-the-counter medicines. Some medicines are not safe to take during pregnancy.  Get plenty of rest and sleep.  Avoid hot tubs and saunas during pregnancy.  Do not have X-rays taken unless absolutely necessary and with the recommendation of your health care provider. A lead shield can be placed on your abdomen to protect your baby when   X-rays are taken in other parts of your body.  Do not empty the cat litter when you are pregnant. It may contain a parasite that causes an infection called toxoplasmosis, which can cause birth defects. Also, use gloves when working in garden areas used by cats.  Do not eat uncooked or undercooked meats or fish.  Do not eat soft, mold-ripened cheeses (Brie, Camembert, and chevre) or soft, blue-veined  cheese (Danish blue and Roquefort).  Stay away from toxic chemicals like:  Insecticides.  Solvents (some cleaners or paint thinners).  Lead.  Mercury.  Sexual intercourse may continue until the end of the pregnancy, unless you have a medical problem or there is a problem with the pregnancy and your health care provider tells you not to.  Do not wear high-heel shoes, especially during the second half of the pregnancy. You can lose your balance and fall.  Do not take long trips, unless absolutely necessary. Be sure to see your health care provider before going on the trip.  Do not sit in one position for more than 2 hours when on a trip.  Take a copy of your medical records when going on a trip. Know where a hospital is located in the city you are visiting, in case of an emergency.  Most dangerous household products will have pregnancy warnings on their labels. Ask your health care provider about products if you are unsure.  Limit or eliminate your caffeine intake from coffee, tea, sodas, medicines, and chocolate.  Many women continue working through pregnancy. Staying active might help you stay healthier. If you have a question about the safety or the hours you work at your particular job, talk with your health care provider.  Get informed:  Read books.  Watch videos.  Go to childbirth classes for you and your significant other.  Talk with experienced moms.  Ask your health care provider about childbirth education classes for you and your partner. Classes can help you and your partner prepare for the birth of your baby.  Ask about a baby doctor (pediatrician) and methods and pain medicine for labor, delivery, and possible cesarean delivery. HOW OFTEN SHOULD I SEE MY HEALTH CARE PROVIDER DURING PREGNANCY?  Your health care provider will give you a schedule for your prenatal visits. You will have visits more often as you get closer to the end of your pregnancy. An average  pregnancy lasts about 40 weeks.  A typical schedule includes visiting your health care provider:   About once each month during your first 6 months of pregnancy.  Every 2 weeks during the next 2 months.  Weekly in the last month, until the delivery date. Your health care provider will probably want to see you more often if:  You are older than 35 years.  Your pregnancy is high risk because you have certain health problems or problems with the pregnancy, such as:  Diabetes.  High blood pressure.  The baby is not growing on schedule, according to the dates of the pregnancy. Your health care provider will do special tests to make sure you and your baby are not having any serious problems. WHAT HAPPENS DURING PRENATAL VISITS?   At your first prenatal visit, your health care provider will do a physical exam and talk to you about your health history and the health history of your partner and your family. Your health care provider will be able to tell you what date to expect your baby to be born on.  Your   first physical exam will include checks of your blood pressure, measurements of your height and weight, and an exam of your pelvic organs. Your health care provider will do a Pap test if you have not had one recently and will do cultures of your cervix to make sure there is no infection.  At each prenatal visit, there will be tests of your blood, urine, blood pressure, weight, and the progress of the baby will be checked.  At your later prenatal visits, your health care provider will check how you are doing and how your baby is developing. You may have a number of tests done as your pregnancy progresses.  Ultrasound exams are often used to check on your baby's growth and health.  You may have more urine and blood tests, as well as special tests, if needed. These may include amniocentesis to examine fluid in the pregnancy sac, stress tests to check how the baby responds to contractions, or a  biophysical profile to measure your baby's well-being. Your health care provider will explain the tests and why they are necessary.  You should be tested for high blood sugar (gestational diabetes) between the 24th and 28th weeks of your pregnancy.  You should discuss with your health care provider your plans to breastfeed or bottle-feed your baby.  Each visit is also a chance for you to learn about staying healthy during pregnancy and to ask questions. Document Released: 05/24/2003 Document Revised: 05/26/2013 Document Reviewed: 11/06/2012 [facility] Patient Information 2015 [place], [place]. This information is not intended to replace advice given to you by your health care provider. Make sure you discuss any questions you have with your health care provider.

## 2014-01-04 ENCOUNTER — Encounter: Payer: 59 | Admitting: Obstetrics & Gynecology

## 2014-01-08 ENCOUNTER — Encounter: Payer: Self-pay | Admitting: *Deleted

## 2014-01-18 ENCOUNTER — Ambulatory Visit (INDEPENDENT_AMBULATORY_CARE_PROVIDER_SITE_OTHER): Payer: 59 | Admitting: Obstetrics & Gynecology

## 2014-01-18 ENCOUNTER — Encounter: Payer: Self-pay | Admitting: Obstetrics & Gynecology

## 2014-01-18 VITALS — BP 126/82 | HR 97 | Temp 97.6°F | Wt 203.0 lb

## 2014-01-18 DIAGNOSIS — Z3480 Encounter for supervision of other normal pregnancy, unspecified trimester: Secondary | ICD-10-CM

## 2014-01-18 DIAGNOSIS — Z3482 Encounter for supervision of other normal pregnancy, second trimester: Secondary | ICD-10-CM

## 2014-01-18 LAB — POCT URINALYSIS DIPSTICK
BILIRUBIN UA: NEGATIVE
Blood, UA: NEGATIVE
Glucose, UA: NEGATIVE
Leukocytes, UA: NEGATIVE
Nitrite, UA: NEGATIVE
PROTEIN UA: NEGATIVE
Urobilinogen, UA: NEGATIVE
pH, UA: 7

## 2014-01-19 LAB — URINALYSIS, ROUTINE W REFLEX MICROSCOPIC
Bilirubin Urine: NEGATIVE
Glucose, UA: NEGATIVE mg/dL
HGB URINE DIPSTICK: NEGATIVE
Ketones, ur: 15 mg/dL — AB
Leukocytes, UA: NEGATIVE
NITRITE: NEGATIVE
PROTEIN: NEGATIVE mg/dL
Specific Gravity, Urine: 1.02 (ref 1.005–1.030)
Urobilinogen, UA: 0.2 mg/dL (ref 0.0–1.0)
pH: 7.5 (ref 5.0–8.0)

## 2014-01-19 LAB — WET PREP BY MOLECULAR PROBE
Candida species: NEGATIVE
Gardnerella vaginalis: NEGATIVE
Trichomonas vaginosis: NEGATIVE

## 2014-01-19 NOTE — Patient Instructions (Signed)
Second Trimester of Pregnancy The second trimester is from week 13 through week 28, months 4 through 6. The second trimester is often a time when you feel your best. Your body has also adjusted to being pregnant, and you begin to feel better physically. Usually, morning sickness has lessened or quit completely, you may have more energy, and you may have an increase in appetite. The second trimester is also a time when the fetus is growing rapidly. At the end of the sixth month, the fetus is about 9 inches long and weighs about 1 pounds. You will likely begin to feel the baby move (quickening) between 18 and 20 weeks of the pregnancy. BODY CHANGES Your body goes through many changes during pregnancy. The changes vary from woman to woman.   Your weight will continue to increase. You will notice your lower abdomen bulging out.  You may begin to get stretch marks on your hips, abdomen, and breasts.  You may develop headaches that can be relieved by medicines approved by your health care provider.  You may urinate more often because the fetus is pressing on your bladder.  You may develop or continue to have heartburn as a result of your pregnancy.  You may develop constipation because certain hormones are causing the muscles that push waste through your intestines to slow down.  You may develop hemorrhoids or swollen, bulging veins (varicose veins).  You may have back pain because of the weight gain and pregnancy hormones relaxing your joints between the bones in your pelvis and as a result of a shift in weight and the muscles that support your balance.  Your breasts will continue to grow and be tender.  Your gums may bleed and may be sensitive to brushing and flossing.  Dark spots or blotches (chloasma, mask of pregnancy) may develop on your face. This will likely fade after the baby is born.  A dark line from your belly button to the pubic area (linea nigra) may appear. This will likely fade  after the baby is born.  You may have changes in your hair. These can include thickening of your hair, rapid growth, and changes in texture. Some women also have hair loss during or after pregnancy, or hair that feels dry or thin. Your hair will most likely return to normal after your baby is born. WHAT TO EXPECT AT YOUR PRENATAL VISITS During a routine prenatal visit:  You will be weighed to make sure you and the fetus are growing normally.  Your blood pressure will be taken.  Your abdomen will be measured to track your baby's growth.  The fetal heartbeat will be listened to.  Any test results from the previous visit will be discussed. Your health care provider may ask you:  How you are feeling.  If you are feeling the baby move.  If you have had any abnormal symptoms, such as leaking fluid, bleeding, severe headaches, or abdominal cramping.  If you have any questions. Other tests that may be performed during your second trimester include:  Blood tests that check for:  Low iron levels (anemia).  Gestational diabetes (between 24 and 28 weeks).  Rh antibodies.  Urine tests to check for infections, diabetes, or protein in the urine.  An ultrasound to confirm the proper growth and development of the baby.  An amniocentesis to check for possible genetic problems.  Fetal screens for spina bifida and Down syndrome. HOME CARE INSTRUCTIONS   Avoid all smoking, herbs, alcohol, and unprescribed   drugs. These chemicals affect the formation and growth of the baby.  Follow your health care provider's instructions regarding medicine use. There are medicines that are either safe or unsafe to take during pregnancy.  Exercise only as directed by your health care provider. Experiencing uterine cramps is a good sign to stop exercising.  Continue to eat regular, healthy meals.  Wear a good support bra for breast tenderness.  Do not use hot tubs, steam rooms, or saunas.  Wear your  seat belt at all times when driving.  Avoid raw meat, uncooked cheese, cat litter boxes, and soil used by cats. These carry germs that can cause birth defects in the baby.  Take your prenatal vitamins.  Try taking a stool softener (if your health care provider approves) if you develop constipation. Eat more high-fiber foods, such as fresh vegetables or fruit and whole grains. Drink plenty of fluids to keep your urine clear or pale yellow.  Take warm sitz baths to soothe any pain or discomfort caused by hemorrhoids. Use hemorrhoid cream if your health care provider approves.  If you develop varicose veins, wear support hose. Elevate your feet for 15 minutes, 3-4 times a day. Limit salt in your diet.  Avoid heavy lifting, wear low heel shoes, and practice good posture.  Rest with your legs elevated if you have leg cramps or low back pain.  Visit your dentist if you have not gone yet during your pregnancy. Use a soft toothbrush to brush your teeth and be gentle when you floss.  A sexual relationship may be continued unless your health care provider directs you otherwise.  Continue to go to all your prenatal visits as directed by your health care provider. SEEK MEDICAL CARE IF:   You have dizziness.  You have mild pelvic cramps, pelvic pressure, or nagging pain in the abdominal area.  You have persistent nausea, vomiting, or diarrhea.  You have a bad smelling vaginal discharge.  You have pain with urination. SEEK IMMEDIATE MEDICAL CARE IF:   You have a fever.  You are leaking fluid from your vagina.  You have spotting or bleeding from your vagina.  You have severe abdominal cramping or pain.  You have rapid weight gain or loss.  You have shortness of breath with chest pain.  You notice sudden or extreme swelling of your face, hands, ankles, feet, or legs.  You have not felt your baby move in over an hour.  You have severe headaches that do not go away with  medicine.  You have vision changes. Document Released: 05/15/2001 Document Revised: 05/26/2013 Document Reviewed: 07/22/2012 ExitCare Patient Information 2015 [employer]. This information is not intended to replace advice given to you by your health care provider. Make sure you discuss any questions you have with your health care provider.  

## 2014-01-19 NOTE — Progress Notes (Signed)
Subjective:    Samantha Lee is a 28 y.o. female being seen today for her obstetrical visit. She is at [redacted]w[redacted]d gestation. Patient reports: occasional cramping . Fetal movement: normal.  Problem List Items Addressed This Visit     High   Supervision of other normal pregnancy - Primary   Relevant Orders      POCT urinalysis dipstick (Completed)      Urine culture      Urinalysis, Routine w reflex microscopic (Completed)      WET PREP BY MOLECULAR PROBE (Completed)      US OB Comp + 14 Wk     Patient Active Problem List   Diagnosis Date Noted  . Previous cesarean section 12/07/2013    Priority: High  . Supervision of other normal pregnancy 12/07/2013    Priority: High  . Endometriosis 01/28/2013  . Routine gynecological examination 11/28/2012  . Unspecified symptom associated with female genital organs 11/28/2012   Objective:    BP 126/82  Pulse 97  Temp(Src) 97.6 F (36.4 C)  Wt 92.08 kg (203 lb)  LMP 09/11/2013 FHT: 160 BPM  Uterine Size: size equals dates     Assessment:    Pregnancy @ [redacted]w[redacted]d    Plan:     Labs, problem list reviewed and updated Follow up in 4 weeks.

## 2014-01-20 ENCOUNTER — Other Ambulatory Visit: Payer: Self-pay | Admitting: Obstetrics & Gynecology

## 2014-01-20 ENCOUNTER — Ambulatory Visit (HOSPITAL_COMMUNITY)
Admission: RE | Admit: 2014-01-20 | Discharge: 2014-01-20 | Disposition: A | Discharge status: Home / Self Care | Payer: 59 | Source: Ambulatory Visit | Attending: Obstetrics & Gynecology | Admitting: Obstetrics & Gynecology

## 2014-01-20 DIAGNOSIS — O34219 Maternal care for unspecified type scar from previous cesarean delivery: Secondary | ICD-10-CM | POA: Insufficient documentation

## 2014-01-20 DIAGNOSIS — Z3482 Encounter for supervision of other normal pregnancy, second trimester: Secondary | ICD-10-CM

## 2014-01-20 DIAGNOSIS — O99210 Obesity complicating pregnancy, unspecified trimester: Secondary | ICD-10-CM

## 2014-01-20 DIAGNOSIS — Z98891 History of uterine scar from previous surgery: Secondary | ICD-10-CM

## 2014-01-20 DIAGNOSIS — O09299 Supervision of pregnancy with other poor reproductive or obstetric history, unspecified trimester: Secondary | ICD-10-CM | POA: Diagnosis not present

## 2014-01-20 DIAGNOSIS — O0942 Supervision of pregnancy with grand multiparity, second trimester: Secondary | ICD-10-CM

## 2014-01-20 DIAGNOSIS — O0940 Supervision of pregnancy with grand multiparity, unspecified trimester: Secondary | ICD-10-CM | POA: Diagnosis not present

## 2014-01-20 DIAGNOSIS — E669 Obesity, unspecified: Secondary | ICD-10-CM | POA: Insufficient documentation

## 2014-01-20 DIAGNOSIS — Z3689 Encounter for other specified antenatal screening: Secondary | ICD-10-CM | POA: Diagnosis present

## 2014-01-20 LAB — URINE CULTURE

## 2014-01-26 ENCOUNTER — Other Ambulatory Visit: Payer: Self-pay | Admitting: *Deleted

## 2014-01-26 ENCOUNTER — Telehealth: Payer: Self-pay

## 2014-01-26 DIAGNOSIS — Z3482 Encounter for supervision of other normal pregnancy, second trimester: Secondary | ICD-10-CM

## 2014-01-26 NOTE — Telephone Encounter (Signed)
let patient know about u/s scheduled on 02/19/14 - left vm message

## 2014-02-01 ENCOUNTER — Encounter: Payer: 59 | Admitting: Obstetrics & Gynecology

## 2014-02-12 ENCOUNTER — Encounter: Payer: Self-pay | Admitting: Obstetrics & Gynecology

## 2014-02-12 ENCOUNTER — Ambulatory Visit (INDEPENDENT_AMBULATORY_CARE_PROVIDER_SITE_OTHER): Payer: 59 | Admitting: Obstetrics & Gynecology

## 2014-02-12 VITALS — BP 131/3 | HR 109 | Temp 98.3°F | Wt 210.0 lb

## 2014-02-12 DIAGNOSIS — Z3482 Encounter for supervision of other normal pregnancy, second trimester: Secondary | ICD-10-CM

## 2014-02-12 DIAGNOSIS — Z3480 Encounter for supervision of other normal pregnancy, unspecified trimester: Secondary | ICD-10-CM

## 2014-02-12 LAB — POCT URINALYSIS DIPSTICK
BILIRUBIN UA: NEGATIVE
GLUCOSE UA: NEGATIVE
Ketones, UA: NEGATIVE
Leukocytes, UA: NEGATIVE
Nitrite, UA: NEGATIVE
PH UA: 6.5
Protein, UA: NEGATIVE
RBC UA: NEGATIVE
Spec Grav, UA: 1.015
Urobilinogen, UA: NEGATIVE

## 2014-02-12 MED ORDER — DOXYLAMINE-PYRIDOXINE 10-10 MG PO TBEC: 2.0000 | DELAYED_RELEASE_TABLET | Freq: Every day | ORAL | Status: DC

## 2014-02-12 NOTE — Patient Instructions (Signed)

## 2014-02-14 NOTE — Progress Notes (Signed)
Subjective:    Samantha Lee is a 28 y.o. female being seen today for her obstetrical visit. She is at [redacted]w[redacted]d gestation. Patient reports: no complaints . Fetal movement: normal.  Problem List Items Addressed This Visit     High   Supervision of other normal pregnancy - Primary   Relevant Medications      Doxylamine-Pyridoxine (DICLEGIS) 10-10 MG TBEC   Other Relevant Orders      POCT urinalysis dipstick (Completed)     Patient Active Problem List   Diagnosis Date Noted  . Previous cesarean section 12/07/2013    Priority: High  . Supervision of other normal pregnancy 12/07/2013    Priority: High  . Endometriosis 01/28/2013  . Routine gynecological examination 11/28/2012  . Unspecified symptom associated with female genital organs 11/28/2012   Objective:    BP 131/3  Pulse 109  Temp(Src) 98.3 F (36.8 C)  Wt 95.255 kg (210 lb)  LMP 09/11/2013 FHT: 160 BPM  Uterine Size: size equals dates     Assessment:    Pregnancy @ [redacted]w[redacted]d    Plan:    OBGCT: ordered for next visit.  Labs, problem list reviewed and updated  Follow up in 4 weeks.

## 2014-02-19 ENCOUNTER — Ambulatory Visit (HOSPITAL_COMMUNITY)
Admission: RE | Admit: 2014-02-19 | Discharge: 2014-02-19 | Disposition: A | Discharge status: Home / Self Care | Payer: 59 | Source: Ambulatory Visit | Attending: Obstetrics & Gynecology | Admitting: Obstetrics & Gynecology

## 2014-02-19 DIAGNOSIS — O34219 Maternal care for unspecified type scar from previous cesarean delivery: Secondary | ICD-10-CM | POA: Insufficient documentation

## 2014-02-19 DIAGNOSIS — Z3689 Encounter for other specified antenatal screening: Secondary | ICD-10-CM | POA: Insufficient documentation

## 2014-02-19 DIAGNOSIS — Z98891 History of uterine scar from previous surgery: Secondary | ICD-10-CM

## 2014-02-19 DIAGNOSIS — Z3482 Encounter for supervision of other normal pregnancy, second trimester: Secondary | ICD-10-CM

## 2014-02-22 ENCOUNTER — Telehealth: Payer: Self-pay | Admitting: *Deleted

## 2014-02-22 NOTE — Telephone Encounter (Signed)
Patient states she is needing more intermittent leave- she is suffering from N/V. 4:26 LM on VM to CB- may need new paperwork or we may be able to addendum original.

## 2014-02-25 NOTE — Telephone Encounter (Signed)
Original addendum and faxed.

## 2014-03-05 ENCOUNTER — Telehealth: Payer: Self-pay

## 2014-03-05 NOTE — Telephone Encounter (Signed)
FMLA papers ready - 15.00 charge - left vm message for patient

## 2014-03-15 ENCOUNTER — Other Ambulatory Visit: Payer: 59

## 2014-03-15 ENCOUNTER — Ambulatory Visit (INDEPENDENT_AMBULATORY_CARE_PROVIDER_SITE_OTHER): Payer: 59 | Admitting: Obstetrics & Gynecology

## 2014-03-15 ENCOUNTER — Encounter: Payer: Self-pay | Admitting: Obstetrics & Gynecology

## 2014-03-15 VITALS — BP 119/79 | HR 87 | Temp 98.4°F | Wt 213.0 lb

## 2014-03-15 DIAGNOSIS — R102 Pelvic and perineal pain: Secondary | ICD-10-CM

## 2014-03-15 DIAGNOSIS — N949 Unspecified condition associated with female genital organs and menstrual cycle: Secondary | ICD-10-CM

## 2014-03-15 DIAGNOSIS — N898 Other specified noninflammatory disorders of vagina: Secondary | ICD-10-CM

## 2014-03-15 DIAGNOSIS — O26892 Other specified pregnancy related conditions, second trimester: Secondary | ICD-10-CM

## 2014-03-15 DIAGNOSIS — Z3482 Encounter for supervision of other normal pregnancy, second trimester: Secondary | ICD-10-CM

## 2014-03-15 LAB — POCT WET PREP (WET MOUNT)
Clue Cells Wet Prep Whiff POC: NEGATIVE
KOH Wet Prep POC: NEGATIVE

## 2014-03-15 LAB — POCT URINALYSIS DIPSTICK
BILIRUBIN UA: NEGATIVE
Blood, UA: NEGATIVE
GLUCOSE UA: NEGATIVE
Ketones, UA: NEGATIVE
Leukocytes, UA: NEGATIVE
Nitrite, UA: NEGATIVE
Protein, UA: NEGATIVE
SPEC GRAV UA: 1.015
Urobilinogen, UA: NEGATIVE
pH, UA: 6

## 2014-03-15 LAB — OB RESULTS CONSOLE GBS: GBS: NEGATIVE

## 2014-03-15 NOTE — Addendum Note (Signed)
Addended by: Lahoma Crocker on: 03/15/2014 02:24 PM   Modules accepted: Orders

## 2014-03-15 NOTE — Patient Instructions (Signed)

## 2014-03-15 NOTE — Progress Notes (Signed)
Subjective:    Samantha Lee is a 28 y.o. female being seen today for her obstetrical visit. She is at [redacted]w[redacted]d gestation. Patient reports: occasional contractions . Fetal movement: normal.  Problem List Items Addressed This Visit     High   Supervision of other normal pregnancy - Primary   Relevant Orders      POCT urinalysis dipstick (Completed)      Fetal non-stress test      Culture, OB Urine      WET PREP BY MOLECULAR PROBE    Other Visit Diagnoses   Pelvic pressure in pregnancy, second trimester        Relevant Orders       Fetal non-stress test       Culture, OB Urine       WET PREP BY MOLECULAR PROBE      Patient Active Problem List   Diagnosis Date Noted  . Previous cesarean section 12/07/2013    Priority: High  . Supervision of other normal pregnancy 12/07/2013    Priority: High  . Endometriosis 01/28/2013  . Routine gynecological examination 11/28/2012  . Unspecified symptom associated with female genital organs 11/28/2012   Objective:    BP 119/79  Pulse 87  Temp(Src) 98.4 F (36.9 C)  Wt 96.616 kg (213 lb)  LMP 09/11/2013 FHT: 160 BPM  Uterine Size: size equals dates     Assessment:    Pregnancy @ [redacted]w[redacted]d    Plan:    OBGCT: ordered for next visit.  Labs, problem list reviewed and updated  Follow up in 2 weeks.

## 2014-03-16 LAB — CULTURE, OB URINE
COLONY COUNT: NO GROWTH
Organism ID, Bacteria: NO GROWTH

## 2014-03-16 LAB — WET PREP BY MOLECULAR PROBE
CANDIDA SPECIES: NEGATIVE
GARDNERELLA VAGINALIS: NEGATIVE
Trichomonas vaginosis: NEGATIVE

## 2014-03-18 ENCOUNTER — Other Ambulatory Visit: Payer: 59

## 2014-03-18 DIAGNOSIS — Z3482 Encounter for supervision of other normal pregnancy, second trimester: Secondary | ICD-10-CM

## 2014-03-18 LAB — CBC
HCT: 33 % — ABNORMAL LOW (ref 36.0–46.0)
Hemoglobin: 11 g/dL — ABNORMAL LOW (ref 12.0–15.0)
MCH: 29.8 pg (ref 26.0–34.0)
MCHC: 33.3 g/dL (ref 30.0–36.0)
MCV: 89.4 fL (ref 78.0–100.0)
Platelets: 254 10*3/uL (ref 150–400)
RBC: 3.69 MIL/uL — ABNORMAL LOW (ref 3.87–5.11)
RDW: 14.6 % (ref 11.5–15.5)
WBC: 10.5 10*3/uL (ref 4.0–10.5)

## 2014-03-19 LAB — HIV ANTIBODY (ROUTINE TESTING W REFLEX): HIV 1&2 Ab, 4th Generation: NONREACTIVE

## 2014-03-19 LAB — GLUCOSE TOLERANCE, 2 HOURS W/ 1HR
GLUCOSE, 2 HOUR: 225 mg/dL — AB (ref 70–139)
Glucose, 1 hour: 241 mg/dL — ABNORMAL HIGH (ref 70–170)
Glucose, Fasting: 126 mg/dL — ABNORMAL HIGH (ref 70–99)

## 2014-03-19 LAB — RPR

## 2014-03-20 ENCOUNTER — Encounter: Payer: Self-pay | Admitting: Obstetrics & Gynecology

## 2014-03-20 DIAGNOSIS — O24419 Gestational diabetes mellitus in pregnancy, unspecified control: Secondary | ICD-10-CM | POA: Insufficient documentation

## 2014-03-23 ENCOUNTER — Telehealth: Payer: Self-pay | Admitting: Obstetrics & Gynecology

## 2014-03-24 NOTE — Telephone Encounter (Signed)
10.21.2015 - Patient has rescheduled appt for Monday afternoon 10.26.2015 at 2:45p. brm

## 2014-03-25 DIAGNOSIS — Z029 Encounter for administrative examinations, unspecified: Secondary | ICD-10-CM

## 2014-03-29 ENCOUNTER — Ambulatory Visit (INDEPENDENT_AMBULATORY_CARE_PROVIDER_SITE_OTHER): Payer: 59 | Admitting: Obstetrics & Gynecology

## 2014-03-29 ENCOUNTER — Encounter: Payer: 59 | Admitting: Obstetrics & Gynecology

## 2014-03-29 ENCOUNTER — Encounter: Payer: Self-pay | Admitting: Obstetrics & Gynecology

## 2014-03-29 VITALS — BP 119/80 | HR 78 | Temp 98.4°F | Wt 215.0 lb

## 2014-03-29 DIAGNOSIS — Z3483 Encounter for supervision of other normal pregnancy, third trimester: Secondary | ICD-10-CM

## 2014-03-29 NOTE — Patient Instructions (Signed)

## 2014-03-29 NOTE — Progress Notes (Signed)
Subjective:    Samantha Lee is a 28 y.o. female being seen today for her obstetrical visit. She is at [redacted]w[redacted]d gestation. Patient reports no complaints. Fetal movement: normal.  Problem List Items Addressed This Visit     High   Supervision of other normal pregnancy - Primary   Relevant Orders      POCT urinalysis dipstick      US OB Follow Up     Patient Active Problem List   Diagnosis Date Noted  . Gestational diabetes 03/20/2014    Priority: High  . Previous cesarean section 12/07/2013    Priority: High  . Supervision of other normal pregnancy 12/07/2013    Priority: High  . Endometriosis 01/28/2013  . Routine gynecological examination 11/28/2012  . Unspecified symptom associated with female genital organs 11/28/2012   Objective:    BP 119/80  Pulse 78  Temp(Src) 98.4 F (36.9 C)  Wt 97.523 kg (215 lb)  LMP 09/11/2013 FHT:  140 BPM  Uterine Size: size equals dates  Presentation: unsure     Assessment:    Pregnancy @ [redacted]w[redacted]d weeks   Plan:     labs reviewed, problem list updated TDAP offered   Follow up in 1 Week.

## 2014-03-30 ENCOUNTER — Telehealth: Payer: Self-pay | Admitting: *Deleted

## 2014-03-30 NOTE — Telephone Encounter (Signed)
Placed call to pt, no answer.  Left message making her aware that she will need to contact insurance company to see what diabetic supplies are covered.  Pt advised to then contact office to let us know of coverage so they can be ordered.

## 2014-04-05 ENCOUNTER — Encounter: Payer: Self-pay | Admitting: Obstetrics & Gynecology

## 2014-04-05 ENCOUNTER — Encounter: Payer: 59 | Admitting: Obstetrics & Gynecology

## 2014-04-06 NOTE — Telephone Encounter (Signed)
LM on VM to CB- need to know what meter her insurance covers.

## 2014-04-07 NOTE — Telephone Encounter (Signed)
Spoke with patient- she is going to go to [place] for her supplies. Called the [employer] meter for her. Patient states she is having pressure with sitting and standing and heavier discharge. Appointment given for cervix check.

## 2014-04-09 ENCOUNTER — Encounter: Payer: 59 | Admitting: Obstetrics & Gynecology

## 2014-04-14 ENCOUNTER — Ambulatory Visit (INDEPENDENT_AMBULATORY_CARE_PROVIDER_SITE_OTHER): Payer: BC Managed Care – PPO

## 2014-04-14 DIAGNOSIS — Z3483 Encounter for supervision of other normal pregnancy, third trimester: Secondary | ICD-10-CM

## 2014-04-14 LAB — US OB FOLLOW UP

## 2014-04-22 ENCOUNTER — Encounter: Payer: Self-pay | Admitting: Obstetrics & Gynecology

## 2014-04-22 ENCOUNTER — Ambulatory Visit (INDEPENDENT_AMBULATORY_CARE_PROVIDER_SITE_OTHER): Payer: 59 | Admitting: Obstetrics & Gynecology

## 2014-04-22 VITALS — BP 123/77 | HR 107 | Temp 98.2°F | Wt 219.0 lb

## 2014-04-22 DIAGNOSIS — O24410 Gestational diabetes mellitus in pregnancy, diet controlled: Secondary | ICD-10-CM

## 2014-04-22 DIAGNOSIS — Z3483 Encounter for supervision of other normal pregnancy, third trimester: Secondary | ICD-10-CM

## 2014-04-22 LAB — POCT URINALYSIS DIPSTICK
Bilirubin, UA: NEGATIVE
Glucose, UA: NEGATIVE
Ketones, UA: NEGATIVE
Leukocytes, UA: NEGATIVE
Nitrite, UA: NEGATIVE
PROTEIN UA: NEGATIVE
RBC UA: NEGATIVE
Spec Grav, UA: 1.015
Urobilinogen, UA: NEGATIVE
pH, UA: 6.5

## 2014-04-22 NOTE — Progress Notes (Signed)
Subjective:    Samantha Lee is a 28 y.o. female being seen today for her obstetrical visit. She is at [redacted]w[redacted]d gestation. Patient reports no complaints and suprapubic pain/pressure w/activity. Fetal movement: normal.  ?compliance w/CBG monitoring/diet  Problem List Items Addressed This Visit    Supervision of other normal pregnancy - Primary   Relevant Orders      POCT urinalysis dipstick    Other Visit Diagnoses    Diet controlled gestational diabetes mellitus in third trimester        Relevant Orders       AMB Referral to Maternal Fetal Medicine (MFM)      Patient Active Problem List   Diagnosis Date Noted  . Gestational diabetes 03/20/2014    Priority: High  . Previous cesarean section 12/07/2013    Priority: High  . Supervision of other normal pregnancy 12/07/2013    Priority: High  . Endometriosis 01/28/2013  . Routine gynecological examination 11/28/2012  . Unspecified symptom associated with female genital organs 11/28/2012   Objective:    BP 123/77 mmHg  Pulse 107  Temp(Src) 98.2 F (36.8 C)  Wt 99.338 kg (219 lb)  LMP 09/11/2013 FHT:  140 BPM  Uterine Size: size equals dates  Presentation: cephalic     Assessment:    Pregnancy @ [redacted]w[redacted]d weeks   Plan:     labs reviewed, problem list updated Declined TDAP  Referral-->[facility]  Follow up in 1 Week.

## 2014-04-23 ENCOUNTER — Other Ambulatory Visit: Payer: Self-pay | Admitting: *Deleted

## 2014-04-23 ENCOUNTER — Telehealth: Payer: Self-pay

## 2014-04-23 DIAGNOSIS — O24419 Gestational diabetes mellitus in pregnancy, unspecified control: Secondary | ICD-10-CM

## 2014-04-23 NOTE — Telephone Encounter (Signed)
let patient know about appt date and time for [place] appt at [facility] - 05/06/14 - at 1:30pm

## 2014-04-28 ENCOUNTER — Encounter: Payer: 59 | Admitting: Obstetrics & Gynecology

## 2014-05-06 ENCOUNTER — Encounter: Payer: 59 | Attending: Obstetrics & Gynecology | Admitting: *Deleted

## 2014-05-06 ENCOUNTER — Ambulatory Visit (INDEPENDENT_AMBULATORY_CARE_PROVIDER_SITE_OTHER): Payer: 59 | Admitting: Obstetrics & Gynecology

## 2014-05-06 ENCOUNTER — Encounter (HOSPITAL_COMMUNITY): Payer: Self-pay | Admitting: *Deleted

## 2014-05-06 ENCOUNTER — Ambulatory Visit (HOSPITAL_COMMUNITY)
Admission: RE | Admit: 2014-05-06 | Discharge: 2014-05-06 | Disposition: A | Discharge status: Home / Self Care | Payer: 59 | Source: Ambulatory Visit | Attending: Obstetrics & Gynecology | Admitting: Obstetrics & Gynecology

## 2014-05-06 ENCOUNTER — Inpatient Hospital Stay (HOSPITAL_COMMUNITY)
Admission: AD | Admit: 2014-05-06 | Discharge: 2014-05-08 | DRG: 778 | Disposition: A | Discharge status: Home / Self Care | Payer: 59 | Source: Ambulatory Visit | Attending: Obstetrics & Gynecology | Admitting: Obstetrics & Gynecology

## 2014-05-06 ENCOUNTER — Inpatient Hospital Stay (HOSPITAL_COMMUNITY): Payer: 59

## 2014-05-06 VITALS — BP 116/75 | HR 86 | Temp 98.7°F | Wt 220.0 lb

## 2014-05-06 DIAGNOSIS — O24410 Gestational diabetes mellitus in pregnancy, diet controlled: Secondary | ICD-10-CM | POA: Diagnosis present

## 2014-05-06 DIAGNOSIS — O24419 Gestational diabetes mellitus in pregnancy, unspecified control: Secondary | ICD-10-CM

## 2014-05-06 DIAGNOSIS — O09219 Supervision of pregnancy with history of pre-term labor, unspecified trimester: Secondary | ICD-10-CM

## 2014-05-06 DIAGNOSIS — O4700 False labor before 37 completed weeks of gestation, unspecified trimester: Secondary | ICD-10-CM | POA: Diagnosis present

## 2014-05-06 DIAGNOSIS — Z8249 Family history of ischemic heart disease and other diseases of the circulatory system: Secondary | ICD-10-CM | POA: Diagnosis not present

## 2014-05-06 DIAGNOSIS — Z3A33 33 weeks gestation of pregnancy: Secondary | ICD-10-CM | POA: Diagnosis present

## 2014-05-06 DIAGNOSIS — O26893 Other specified pregnancy related conditions, third trimester: Secondary | ICD-10-CM | POA: Insufficient documentation

## 2014-05-06 DIAGNOSIS — R102 Pelvic and perineal pain: Secondary | ICD-10-CM

## 2014-05-06 DIAGNOSIS — Z3483 Encounter for supervision of other normal pregnancy, third trimester: Secondary | ICD-10-CM

## 2014-05-06 LAB — CBC
HEMATOCRIT: 34.5 % — AB (ref 36.0–46.0)
HEMOGLOBIN: 11.5 g/dL — AB (ref 12.0–15.0)
MCH: 29.9 pg (ref 26.0–34.0)
MCHC: 33.3 g/dL (ref 30.0–36.0)
MCV: 89.6 fL (ref 78.0–100.0)
Platelets: 256 10*3/uL (ref 150–400)
RBC: 3.85 MIL/uL — ABNORMAL LOW (ref 3.87–5.11)
RDW: 14.1 % (ref 11.5–15.5)
WBC: 11.4 10*3/uL — ABNORMAL HIGH (ref 4.0–10.5)

## 2014-05-06 MED ORDER — BETAMETHASONE SOD PHOS & ACET 6 (3-3) MG/ML IJ SUSP
12.0000 mg | INTRAMUSCULAR | Status: AC
  Administered 2014-05-06 – 2014-05-07 (×2): 12 mg via INTRAMUSCULAR
  Filled 2014-05-06 (×2): qty 2

## 2014-05-06 MED ORDER — MAGNESIUM SULFATE 40 G IN LACTATED RINGERS - SIMPLE
2.0000 g/h | INTRAVENOUS | Status: DC
  Filled 2014-05-06 (×2): qty 500

## 2014-05-06 MED ORDER — PROMETHAZINE HCL 25 MG/ML IJ SOLN
25.0000 mg | Freq: Once | INTRAMUSCULAR | Status: AC
  Administered 2014-05-06: 25 mg via INTRAMUSCULAR
  Filled 2014-05-06: qty 1

## 2014-05-06 MED ORDER — ZOLPIDEM TARTRATE 5 MG PO TABS
5.0000 mg | ORAL_TABLET | Freq: Every evening | ORAL | Status: DC | PRN
  Administered 2014-05-07: 5 mg via ORAL
  Filled 2014-05-06: qty 1

## 2014-05-06 MED ORDER — ACETAMINOPHEN 325 MG PO TABS: 650.0000 mg | ORAL_TABLET | ORAL | Status: DC | PRN

## 2014-05-06 MED ORDER — MORPHINE SULFATE 10 MG/ML IJ SOLN
10.0000 mg | Freq: Once | INTRAMUSCULAR | Status: AC
  Administered 2014-05-06: 10 mg via INTRAMUSCULAR
  Filled 2014-05-06: qty 1

## 2014-05-06 MED ORDER — MAGNESIUM SULFATE BOLUS VIA INFUSION
4.0000 g | Freq: Once | INTRAVENOUS | Status: AC
  Administered 2014-05-06: 4 g via INTRAVENOUS
  Filled 2014-05-06: qty 500

## 2014-05-06 MED ORDER — NIFEDIPINE 10 MG PO CAPS: 10.0000 mg | ORAL_CAPSULE | Freq: Four times a day (QID) | ORAL | Status: DC

## 2014-05-06 MED ORDER — CALCIUM CARBONATE ANTACID 500 MG PO CHEW: 2.0000 | CHEWABLE_TABLET | ORAL | Status: DC | PRN

## 2014-05-06 MED ORDER — DOCUSATE SODIUM 100 MG PO CAPS
100.0000 mg | ORAL_CAPSULE | Freq: Every day | ORAL | Status: DC
Start: 2014-05-07 — End: 2014-05-08
  Administered 2014-05-07 – 2014-05-08 (×2): 100 mg via ORAL
  Filled 2014-05-06 (×2): qty 1

## 2014-05-06 MED ORDER — LACTATED RINGERS IV SOLN
INTRAVENOUS | Status: DC
Start: 2014-05-06 — End: 2014-05-08
  Administered 2014-05-06 – 2014-05-08 (×3): via INTRAVENOUS

## 2014-05-06 MED ORDER — PRENATAL MULTIVITAMIN CH
1.0000 | ORAL_TABLET | Freq: Every day | ORAL | Status: DC
  Administered 2014-05-08: 1 via ORAL
  Filled 2014-05-06: qty 1

## 2014-05-06 NOTE — Progress Notes (Signed)
Pt stating that she has had this pain fir a couple weeks and that her Dr checked her in the office and said she was "thinning out".

## 2014-05-06 NOTE — Plan of Care (Signed)
Problem: Consults Goal: Antepartum Patient Education Outcome: Completed/Met Date Met:  05/06/14 Goal: Birthing Suites Patient Information Press F2 to bring up selections list   Pt < [redacted] weeks EGA Goal: Orientation to unit: [place] (smoking, visitation, chaplain services, helpline)  Outcome: Completed/Met Date Met:  05/06/14  Problem: Phase I Progression Outcomes Goal: Initial discharge plan identified Outcome: Completed/Met Date Met:  05/06/14  Problem: Phase II Progression Outcomes Goal: Mattress in use Mattress in use ( [facility], Med/Surg, Regular, Birthing Suite, Other)  Outcome: Completed/Met Date Met:  05/06/14

## 2014-05-06 NOTE — Progress Notes (Signed)
  Patient was seen on 05/06/14 for Gestational Diabetes self-management . The following learning objectives were met by the patient :   States the definition of Gestational Diabetes  States why dietary management is important in controlling blood glucose  Describes the effects of carbohydrates on blood glucose levels  Demonstrates ability to create a balanced meal plan  Demonstrates carbohydrate counting   States when to check blood glucose levels  Demonstrates proper blood glucose monitoring techniques  States the effect of stress and exercise on blood glucose levels  States the importance of limiting caffeine and abstaining from alcohol and smoking  Plan:  Aim for 2 Carb Choices per meal (30 grams) +/- 1 either way for breakfast Aim for 3 Carb Choices per meal (45 grams) +/- 1 either way from lunch and dinner Aim for 1-2 Carbs per snack Begin reading food labels for Total Carbohydrate and sugar grams of foods Consider  increasing your activity level by walking daily as tolerated Begin checking BG before breakfast and 2 hours after first bit of breakfast, lunch and dinner after  as directed by MD  Take medication  as directed by MD  Blood glucose monitor given: Patient purchased [employer] from [facility]  Patient instructed to monitor glucose levels: FBS: 60 - <90 2 hour: <120  Patient received the following handouts:  Nutrition Diabetes and Pregnancy  Carbohydrate Counting List  Meal Planning worksheet  Patient will be seen for follow-up as needed.

## 2014-05-06 NOTE — MAU Note (Signed)
Noted "admission" on sign in sheet, called ante unit- pt is a direct admit- they will come get pt

## 2014-05-07 ENCOUNTER — Telehealth: Payer: Self-pay | Admitting: *Deleted

## 2014-05-07 LAB — GLUCOSE, CAPILLARY
GLUCOSE-CAPILLARY: 123 mg/dL — AB (ref 70–99)
GLUCOSE-CAPILLARY: 141 mg/dL — AB (ref 70–99)
GLUCOSE-CAPILLARY: 163 mg/dL — AB (ref 70–99)
Glucose-Capillary: 120 mg/dL — ABNORMAL HIGH (ref 70–99)
Glucose-Capillary: 128 mg/dL — ABNORMAL HIGH (ref 70–99)

## 2014-05-07 LAB — TYPE AND SCREEN
ABO/RH(D): O POS
Antibody Screen: NEGATIVE

## 2014-05-07 LAB — ABO/RH: ABO/RH(D): O POS

## 2014-05-07 NOTE — Telephone Encounter (Signed)
Patient called requesting for a change to her husband's FMLA paperwork so he can be with her at the hospital. Patient was admitted to the hospital on 05-06-14 for contractions and increasing pain.  Contacted patient and advised her that the nurse who usually handles the paperwork is not in the office today. Advised the patient that I tried to locate the paperwork in an attempt to fill it out for her but was unable to do so and would speak with Opal Sidles on Monday to get it taken care of. Patient verbalized understanding.

## 2014-05-08 DIAGNOSIS — O4700 False labor before 37 completed weeks of gestation, unspecified trimester: Secondary | ICD-10-CM | POA: Diagnosis present

## 2014-05-08 LAB — GLUCOSE, CAPILLARY
GLUCOSE-CAPILLARY: 142 mg/dL — AB (ref 70–99)
Glucose-Capillary: 170 mg/dL — ABNORMAL HIGH (ref 70–99)

## 2014-05-08 NOTE — H&P (Signed)
Samantha Lee is a 28 y.o. female presenting with contractions. Maternal Medical History:  Reason for admission: Contractions.  She complains of focal pain at the prior C/D delivery scar x 1 week.  An NST in the office showed irregular UCs.  Contractions: Onset was 1 week ago.   Frequency: irregular.   Perceived severity is moderate.    Fetal activity: Perceived fetal activity is normal.    Prenatal Complications - Diabetes: gestational. Diabetes is managed by diet.      OB History    Gravida Para Term Preterm AB TAB SAB Ectopic Multiple Living   4 3 2 1  0  0   2     Past Medical History  Diagnosis Date  . Headache(784.0)     otc med prn  . Endometriosis   . Preterm labor   . Gestational diabetes    Past Surgical History  Procedure Laterality Date  . Cesarean section      x 2  . Dilation and curettage of uterus      mab  . Laparoscopy N/A 01/15/2013    Procedure: LAPAROSCOPY DIAGNOSTIC  ;  Surgeon: Lahoma Crocker, MD;  Location: [facility];  Service: Gynecology;  Laterality: N/A;  . Laparoscopic lysis of adhesions N/A 01/15/2013    Procedure: LAPAROSCOPIC LYSIS OF ADHESIONS;  Surgeon: Lahoma Crocker, MD;  Location: [facility] [facility];  Service: Gynecology;  Laterality: N/A;  with ablation of endometriotic implants   . Laparoscopy Right 01/15/2013    Procedure: LAPAROSCOPY OPERATIVE  ;  Surgeon: Lahoma Crocker, MD;  Location: [facility];  Service: Gynecology;  Laterality: Right;  R sidewall biopsy   Family History: family history includes Hypertension in her mother. Social History:  reports that she has never smoked. She has never used smokeless tobacco. She reports that she does not drink alcohol or use illicit drugs.     Review of Systems  Constitutional: Negative for fever.  Eyes: Negative for blurred vision.  Respiratory: Negative for shortness of breath.   Gastrointestinal: Negative for vomiting.  Skin: Negative for rash.  Neurological: Negative for headaches.       Blood pressure 112/57, pulse 86, temperature 98.5 F (36.9 C), temperature source Oral, resp. rate 20, height 5' (1.524 m), weight 99.791 kg (220 lb), last menstrual period 09/11/2013. Maternal Exam:  Abdomen: Patient reports no abdominal tenderness. Fetal presentation: vertex  Introitus: Normal vulva. Cervix: Cervix evaluated by digital exam.     Fetal Exam Fetal Monitor Review: Baseline rate: 140.  Variability: moderate (6-25 bpm).   Pattern: accelerations present and no decelerations.    Fetal State Assessment: Category I - tracings are normal.     Physical Exam  Constitutional: She appears well-developed.  HENT:  Head: Normocephalic.  Neck: Neck supple. No thyromegaly present.  Cardiovascular: Normal rate and regular rhythm.   Respiratory: Breath sounds normal.  GI: Soft. Bowel sounds are normal.  Skin: No rash noted.    Prenatal labs: ABO, Rh: --/--/O POS, O POS (12/03 2318) Antibody: NEG (12/03 2318) Rubella: 1.99 (06/04 1725) RPR: NON REAC (10/15 1011)  HBsAg: NEGATIVE (06/04 1725)  HIV: NONREACTIVE (10/15 1011)  GBS:     Assessment/Plan: Multipara @ 33.6 wks.  H/O previous C/D.  Threatened PTL GDM--diet controlled Category I FHT  Admit Tocolysis ADA diet Betamethasone   JACKSON-MOORE,Linnae Rasool A 05/08/2014, 10:05 AM

## 2014-05-08 NOTE — Progress Notes (Signed)
Subjective:    Samantha Lee is a 28 y.o. female being seen today for her obstetrical visit. She is at [redacted]w[redacted]d gestation. Patient reports worsening abdominal pain; pain at the prior C/D site. Fetal movement: normal.  CBGs in range  Problem List Items Addressed This Visit    Supervision of other normal pregnancy - Primary   Relevant Orders      POCT urinalysis dipstick     Patient Active Problem List   Diagnosis Date Noted  . Gestational diabetes 03/20/2014    Priority: High  . Previous cesarean section 12/07/2013    Priority: High  . Supervision of other normal pregnancy 12/07/2013    Priority: High  . Threatened preterm labor 05/08/2014  . Hx of PTL (preterm labor), current pregnancy 05/06/2014  . Endometriosis 01/28/2013  . Routine gynecological examination 11/28/2012  . Unspecified symptom associated with female genital organs 11/28/2012   Objective:    BP 116/75 mmHg  Pulse 86  Temp(Src) 98.7 F (37.1 C)  Wt 99.791 kg (220 lb)  LMP 09/11/2013 FHT:  140 BPM  Uterine Size: size equals dates  Presentation: cephalic     Assessment:    Pregnancy @ [redacted]w[redacted]d weeks   Threatened PTL  Plan:     labs reviewed, problem list updated To [facility] for evaluation/observation  Follow up in 1 Week.

## 2014-05-08 NOTE — Progress Notes (Signed)
Magnesium sulfate turned off per order from [facility].

## 2014-05-08 NOTE — Patient Instructions (Signed)

## 2014-05-08 NOTE — Progress Notes (Signed)
Patient discharged home per wheelchair with s/o at side.  Discharge instructions reviewed with patient and significant other. Both state an understanding. Patient provided teach back. Will follow up in one week with provider.

## 2014-05-08 NOTE — Discharge Summary (Signed)
Physician Discharge Summary  Patient ID: ARLEE BOSSARD MRN: [id number] DOB/AGE: [birth year] 28 y.o.  Admit date: 05/06/2014 Discharge date: 05/08/2014  Admission Diagnoses: Active Problems:   Hx of PTL (preterm labor), current pregnancy   Threatened preterm labor  Discharge Diagnoses:  Active Problems:   Hx of PTL (preterm labor), current pregnancy   Threatened preterm labor   Discharged Condition: stable  Hospital Course: The patient was started on Procardia for tocolysis.  This was changed to MgSO4 and the contractions were arrested.  A limited OB U/S was unremarkable.  She received a course of steroids.  She was previously diagnosed with diet-controlled GDM.  The CBGs were <180, but mildly elevated.  Consults: None  Significant Diagnostic Studies: U/S  Treatments: see above  Discharge Exam: Blood pressure 112/57, pulse 86, temperature 98.5 F (36.9 C), temperature source Oral, resp. rate 20, height 5' (1.524 m), weight 99.791 kg (220 lb), last menstrual period 09/11/2013. General appearance: alert Resp: clear to auscultation bilaterally Cardio: regular rate and rhythm, S1, S2 normal, no murmur, click, rub or gallop GI: soft, non-tender; bowel sounds normal; no masses,  no organomegaly Extremities: extremities normal, atraumatic, no cyanosis or edema  Disposition: 01-Home or Self Care  Discharge Instructions    Discharge activity:    Complete by:  As directed   No strenuous activity     Discharge diet:    Complete by:  As directed   Carb modified     Do not have sex or do anything that might make you have an orgasm    Complete by:  As directed      Notify physician for a general feeling that "something is not right"    Complete by:  As directed      Notify physician for increase or change in vaginal discharge    Complete by:  As directed      Notify physician for intestinal cramps, with or without diarrhea, sometimes described as "gas pain"    Complete by:  As  directed      Notify physician for leaking of fluid    Complete by:  As directed      Notify physician for low, dull backache, unrelieved by heat or Tylenol    Complete by:  As directed      Notify physician for menstrual like cramps    Complete by:  As directed      Notify physician for pelvic pressure    Complete by:  As directed      Notify physician for uterine contractions.  These may be painless and feel like the uterus is tightening or the baby is  "balling up"    Complete by:  As directed      Notify physician for vaginal bleeding    Complete by:  As directed      PRETERM LABOR:  Includes any of the follwing symptoms that occur between 20 - [redacted] weeks gestation.  If these symptoms are not stopped, preterm labor can result in preterm delivery, placing your baby at risk    Complete by:  As directed             Medication List    TAKE these medications        prenatal multivitamin Tabs tablet  Take 1 tablet by mouth daily.           Follow-up Information    Follow up with Agnes Lawrence, MD. Schedule an appointment as soon as possible for a  visit in 1 week.   Specialty:  Obstetrics and Gynecology   Contact information:   [address] [place] [address] [phone]       Signed: Agnes Lawrence 05/08/2014, 10:15 AM

## 2014-05-08 NOTE — Discharge Instructions (Signed)

## 2014-05-10 ENCOUNTER — Telehealth: Payer: Self-pay | Admitting: *Deleted

## 2014-05-10 NOTE — Telephone Encounter (Signed)
Patient called to request that her husband's paperwork be updated for FMLA as she has had preterm labor. 3:58 Call to patient- LM on VM to CB- patient needs an appointment.

## 2014-05-11 DIAGNOSIS — O26893 Other specified pregnancy related conditions, third trimester: Secondary | ICD-10-CM | POA: Insufficient documentation

## 2014-05-11 DIAGNOSIS — R102 Pelvic and perineal pain: Secondary | ICD-10-CM

## 2014-05-11 NOTE — Telephone Encounter (Signed)
Spoke with patient- she is coming this week. Paperwork complete.

## 2014-05-13 ENCOUNTER — Other Ambulatory Visit: Payer: Self-pay | Admitting: Obstetrics

## 2014-05-13 ENCOUNTER — Inpatient Hospital Stay (HOSPITAL_COMMUNITY)
Admission: AD | Admit: 2014-05-13 | Discharge: 2014-05-16 | DRG: 778 | Disposition: A | Discharge status: Home / Self Care | Payer: 59 | Source: Ambulatory Visit | Attending: Obstetrics & Gynecology | Admitting: Obstetrics & Gynecology

## 2014-05-13 ENCOUNTER — Ambulatory Visit (INDEPENDENT_AMBULATORY_CARE_PROVIDER_SITE_OTHER): Payer: 59 | Admitting: Obstetrics & Gynecology

## 2014-05-13 ENCOUNTER — Encounter: Payer: Self-pay | Admitting: Obstetrics & Gynecology

## 2014-05-13 VITALS — BP 108/73 | HR 102 | Temp 97.1°F | Wt 217.0 lb

## 2014-05-13 DIAGNOSIS — Z3A34 34 weeks gestation of pregnancy: Secondary | ICD-10-CM | POA: Diagnosis present

## 2014-05-13 DIAGNOSIS — O4703 False labor before 37 completed weeks of gestation, third trimester: Secondary | ICD-10-CM

## 2014-05-13 DIAGNOSIS — O3421 Maternal care for scar from previous cesarean delivery: Secondary | ICD-10-CM | POA: Diagnosis present

## 2014-05-13 DIAGNOSIS — O24419 Gestational diabetes mellitus in pregnancy, unspecified control: Secondary | ICD-10-CM | POA: Diagnosis present

## 2014-05-13 DIAGNOSIS — O09219 Supervision of pregnancy with history of pre-term labor, unspecified trimester: Secondary | ICD-10-CM

## 2014-05-13 DIAGNOSIS — Z029 Encounter for administrative examinations, unspecified: Secondary | ICD-10-CM

## 2014-05-13 DIAGNOSIS — O09213 Supervision of pregnancy with history of pre-term labor, third trimester: Secondary | ICD-10-CM

## 2014-05-13 DIAGNOSIS — O0993 Supervision of high risk pregnancy, unspecified, third trimester: Secondary | ICD-10-CM

## 2014-05-13 DIAGNOSIS — Z98891 History of uterine scar from previous surgery: Secondary | ICD-10-CM

## 2014-05-13 DIAGNOSIS — Z3481 Encounter for supervision of other normal pregnancy, first trimester: Secondary | ICD-10-CM

## 2014-05-13 LAB — POCT URINALYSIS DIPSTICK
Bilirubin, UA: NEGATIVE
Blood, UA: NEGATIVE
Glucose, UA: NEGATIVE
Ketones, UA: NEGATIVE
Leukocytes, UA: NEGATIVE
Nitrite, UA: NEGATIVE
Protein, UA: NEGATIVE
Spec Grav, UA: <=1.005
Urobilinogen, UA: NEGATIVE
pH, UA: 7

## 2014-05-13 MED ORDER — NIFEDIPINE 10 MG PO CAPS
10.0000 mg | ORAL_CAPSULE | Freq: Four times a day (QID) | ORAL | Status: DC
  Administered 2014-05-13 – 2014-05-16 (×11): 10 mg via ORAL
  Filled 2014-05-13 (×11): qty 1

## 2014-05-13 MED ORDER — ACETAMINOPHEN 325 MG PO TABS: 650.0000 mg | ORAL_TABLET | ORAL | Status: DC | PRN

## 2014-05-13 MED ORDER — LACTATED RINGERS IV SOLN: INTRAVENOUS | Status: DC

## 2014-05-13 MED ORDER — PROMETHAZINE HCL 25 MG/ML IJ SOLN
25.0000 mg | Freq: Once | INTRAMUSCULAR | Status: AC
  Administered 2014-05-13: 25 mg via INTRAMUSCULAR
  Filled 2014-05-13: qty 1

## 2014-05-13 MED ORDER — ONDANSETRON 4 MG PO TBDP: 8.0000 mg | ORAL_TABLET | Freq: Four times a day (QID) | ORAL | Status: DC | PRN

## 2014-05-13 MED ORDER — ZOLPIDEM TARTRATE 5 MG PO TABS: 5.0000 mg | ORAL_TABLET | Freq: Every evening | ORAL | Status: DC | PRN

## 2014-05-13 MED ORDER — PRENATAL MULTIVITAMIN CH
1.0000 | ORAL_TABLET | Freq: Every day | ORAL | Status: DC
  Administered 2014-05-14 – 2014-05-16 (×3): 1 via ORAL
  Filled 2014-05-13 (×3): qty 1

## 2014-05-13 MED ORDER — PANTOPRAZOLE SODIUM 40 MG IV SOLR
40.0000 mg | Freq: Every day | INTRAVENOUS | Status: DC
  Administered 2014-05-13: 40 mg via INTRAVENOUS
  Filled 2014-05-13: qty 40

## 2014-05-13 MED ORDER — LACTATED RINGERS IV SOLN
INTRAVENOUS | Status: DC
  Administered 2014-05-14 – 2014-05-15 (×5): via INTRAVENOUS

## 2014-05-13 MED ORDER — MORPHINE SULFATE 10 MG/ML IJ SOLN
10.0000 mg | Freq: Once | INTRAMUSCULAR | Status: AC
Start: 2014-05-13 — End: 2014-05-13
  Administered 2014-05-13: 10 mg via INTRAMUSCULAR
  Filled 2014-05-13: qty 1

## 2014-05-13 MED ORDER — CALCIUM CARBONATE ANTACID 500 MG PO CHEW
2.0000 | CHEWABLE_TABLET | ORAL | Status: DC | PRN
  Administered 2014-05-13: 400 mg via ORAL
  Filled 2014-05-13: qty 1

## 2014-05-13 MED ORDER — DOCUSATE SODIUM 100 MG PO CAPS
100.0000 mg | ORAL_CAPSULE | Freq: Every day | ORAL | Status: DC
  Administered 2014-05-14 – 2014-05-15 (×2): 100 mg via ORAL
  Filled 2014-05-13 (×3): qty 1

## 2014-05-13 MED ORDER — PRENATAL MULTIVITAMIN CH: 1.0000 | ORAL_TABLET | Freq: Every day | ORAL | Status: DC

## 2014-05-13 MED ORDER — NIFEDIPINE 10 MG PO CAPS
20.0000 mg | ORAL_CAPSULE | Freq: Once | ORAL | Status: AC
  Administered 2014-05-13: 20 mg via ORAL
  Filled 2014-05-13: qty 2

## 2014-05-13 MED ORDER — LACTATED RINGERS IV BOLUS (SEPSIS)
1000.0000 mL | Freq: Once | INTRAVENOUS | Status: AC
  Administered 2014-05-13: 1000 mL via INTRAVENOUS

## 2014-05-13 NOTE — Progress Notes (Signed)
Spoke with Dr. Jodi Mourning pt states she feels better now would rather not stay if she does not have to. Dr. Jodi Mourning wants pt to stay but can go with out IV.

## 2014-05-13 NOTE — H&P (Signed)
Samantha Lee is a 28 y.o. female presenting for UC's in office and very uncomfortable with contractions.  Cervical exam negative for labor. Maternal Medical History:  Reason for admission: Contractions.   Contractions: Perceived severity is mild.    Fetal activity: Perceived fetal activity is normal.    Prenatal Complications - Diabetes: gestational. Diabetes is managed by diet.      OB History    Gravida Para Term Preterm AB TAB SAB Ectopic Multiple Living   4 3 2 1  0  0   2     Past Medical History  Diagnosis Date  . Headache(784.0)     otc med prn  . Endometriosis   . Preterm labor   . Gestational diabetes    Past Surgical History  Procedure Laterality Date  . Cesarean section      x 2  . Dilation and curettage of uterus      mab  . Laparoscopy N/A 01/15/2013    Procedure: LAPAROSCOPY DIAGNOSTIC  ;  Surgeon: Lahoma Crocker, MD;  Location: [facility];  Service: Gynecology;  Laterality: N/A;  . Laparoscopic lysis of adhesions N/A 01/15/2013    Procedure: LAPAROSCOPIC LYSIS OF ADHESIONS;  Surgeon: Lahoma Crocker, MD;  Location: [facility];  Service: Gynecology;  Laterality: N/A;  with ablation of endometriotic implants   . Laparoscopy Right 01/15/2013    Procedure: LAPAROSCOPY OPERATIVE  ;  Surgeon: Lahoma Crocker, MD;  Location: [facility];  Service: Gynecology;  Laterality: Right;  R sidewall biopsy   Family History: family history includes Hypertension in her mother. Social History:  reports that she has never smoked. She has never used smokeless tobacco. She reports that she does not drink alcohol or use illicit drugs.   Prenatal Transfer Tool  Maternal Diabetes: Yes:  Diabetes Type:  Diet controlled Genetic Screening: Normal Maternal Ultrasounds/Referrals: Normal Fetal Ultrasounds or other Referrals:  None Maternal Substance Abuse:  No Significant Maternal Medications:  None Significant Maternal Lab Results:  None Other Comments:  None  Review of Systems   All other systems reviewed and are negative.     Blood pressure 108/73, pulse 102, temperature 97.1 F (36.2 C), weight 217 lb (98.431 kg), last menstrual period 09/11/2013. Maternal Exam:  Uterine Assessment: Contraction strength is mild.  Contraction frequency is irregular.   Abdomen: Patient reports no abdominal tenderness. Cervix: Cervix evaluated by digital exam.     Physical Exam  Nursing note and vitals reviewed. Constitutional: She is oriented to person, place, and time. She appears well-developed and well-nourished.  HENT:  Head: Normocephalic and atraumatic.  Eyes: Conjunctivae are normal. Pupils are equal, round, and reactive to light.  Neck: Normal range of motion. Neck supple.  Cardiovascular: Normal rate and regular rhythm.   Respiratory: Effort normal and breath sounds normal.  GI: Soft.  Genitourinary: Vagina normal and uterus normal.  Musculoskeletal: Normal range of motion.  Neurological: She is alert and oriented to person, place, and time.  Skin: Skin is warm and dry.  Psychiatric: She has a normal mood and affect. Her behavior is normal. Judgment and thought content normal.  Cervix:  2cm / 60% / -3 / Vtx Prenatal labs: ABO, Rh: --/--/O POS, O POS (12/03 2318) Antibody: NEG (12/03 2318) Rubella: 1.99 (06/04 1725) RPR: NON REAC (10/15 1011)  HBsAg: NEGATIVE (06/04 1725)  HIV: NONREACTIVE (10/15 1011)  GBS:     Assessment/Plan: 34.6 weeks.  H/O 2 previous C/S's.  Threatened PTL.  Admit for bedrest and observation.  Azoria Abbett A 05/13/2014, 4:13 PM

## 2014-05-14 ENCOUNTER — Encounter (HOSPITAL_COMMUNITY): Payer: Self-pay | Admitting: General Practice

## 2014-05-14 LAB — TYPE AND SCREEN
ABO/RH(D): O POS
Antibody Screen: NEGATIVE

## 2014-05-14 MED ORDER — LACTATED RINGERS IV BOLUS (SEPSIS)
1000.0000 mL | Freq: Once | INTRAVENOUS | Status: AC
  Administered 2014-05-14: 1000 mL via INTRAVENOUS

## 2014-05-14 MED ORDER — PANTOPRAZOLE SODIUM 40 MG PO TBEC
40.0000 mg | DELAYED_RELEASE_TABLET | Freq: Every day | ORAL | Status: DC
  Administered 2014-05-14 – 2014-05-15 (×2): 40 mg via ORAL
  Filled 2014-05-14 (×2): qty 1

## 2014-05-14 NOTE — Progress Notes (Signed)

## 2014-05-14 NOTE — Progress Notes (Signed)
Patient ID: SERRENA LINDERMAN, female   DOB: July 10, 1985, 28 y.o.   MRN: [id number] Hospital Day: 2  S: Preterm labor symptoms: UC's and pain.  O: Blood pressure 105/49, pulse 78, temperature 98.3 F (36.8 C), temperature source Oral, resp. rate 18, height 5' (1.524 m), weight 217 lb (98.431 kg), last menstrual period 09/11/2013.   YEM:VVKPQAES: 150 bpm Toco: Irregular, mild UC's SVE: 2 / 60% / -3 / VTX  A/P- 28 y.o. admitted with:  UC's at 34 weeks.  H/O previous C/S x 2.  Stable.  Continue bedrest.  Present on Admission:  **None**  Pregnancy Complications: gestational diabetes  Preterm labor management: bedrest advised Dating:  [redacted]w[redacted]d PNL Needed:  stable FWB:  good PTL:  Stable ROD: Repeat C/S

## 2014-05-14 NOTE — Progress Notes (Signed)
Subjective:    Samantha Lee is a 28 y.o. female being seen today for her obstetrical visit. She is at [redacted]w[redacted]d gestation. Patient reports worsening contractions. Fetal movement: normal.  CBGs in range  Problem List Items Addressed This Visit    None    Visit Diagnoses    Preterm contractions, third trimester    -  Primary    Relevant Orders       Fetal non-stress test    Supervision of high risk pregnancy in third trimester        Relevant Orders       POCT urinalysis dipstick (Completed)      Patient Active Problem List   Diagnosis Date Noted  . Gestational diabetes 03/20/2014    Priority: High  . Previous cesarean section 12/07/2013    Priority: High  . Supervision of other normal pregnancy 12/07/2013    Priority: High  . Pelvic pain during pregnancy in third trimester, antepartum   . Threatened preterm labor 05/08/2014  . Hx of PTL (preterm labor), current pregnancy 05/06/2014  . Endometriosis 01/28/2013  . Routine gynecological examination 11/28/2012  . Unspecified symptom associated with female genital organs 11/28/2012   Objective:    BP 108/73 mmHg  Pulse 102  Temp(Src) 97.1 F (36.2 C)  Wt 98.431 kg (217 lb)  LMP 09/11/2013 FHT:  140 BPM  Uterine Size: size equals dates  Presentation: cephalic     Assessment:    Pregnancy @ [redacted]w[redacted]d weeks   Threatened PTL  Plan:     labs reviewed, problem list updated To [facility] for evaluation/observation  Follow up in 1 Week.

## 2014-05-14 NOTE — Progress Notes (Signed)
UR completed 

## 2014-05-14 NOTE — Progress Notes (Signed)
Antenatal Nutrition Assessment:  Currently  [redacted] weeks gestation, with PTL. Height  60 "  Weight 217 lbs  pre-pregnancy weight 194 lbs .  Pre-pregnancy  BMI 37.9  IBW 100 lbs Total weight gain 23.lbs Weight gain goals 11-20 lbs Estimated needs: 2000-2200 kcal/day, 85-95 grams protein/day, 2.3 liters fluid/day  CHO modified gestational diet  Current diet prescription will provide for increased needs.  Nutrition related labs  CBG (last 3)  No results for input(s): GLUCAP in the last 72 hours.    Nutrition Dx: Increased nutrient needs r/t pregnancy and fetal growth requirements aeb [redacted] weeks gestation.  No educational needs assessed at this time. GDM education documented at MFM  on 12/3  Cathlean Sauer.Fredderick Severance LDN Neonatal Nutrition Support Specialist/RD III Pager [phone]

## 2014-05-15 MED ORDER — NIFEDIPINE 10 MG PO CAPS
10.0000 mg | ORAL_CAPSULE | Freq: Once | ORAL | Status: AC
  Administered 2014-05-15: 10 mg via ORAL
  Filled 2014-05-15: qty 1

## 2014-05-15 MED ORDER — SODIUM CHLORIDE 0.9 % IJ SOLN
3.0000 mL | Freq: Two times a day (BID) | INTRAMUSCULAR | Status: DC
  Administered 2014-05-15: 3 mL via INTRAVENOUS

## 2014-05-15 NOTE — Progress Notes (Signed)
Patient ID: Samantha Lee, female   DOB: 07/03/1985, 28 y.o.   MRN: [id number] Hospital Day: 3  S: No complaints.  O: Blood pressure 103/56, pulse 91, temperature 98.5 F (36.9 C), temperature source Oral, resp. rate 18, height 5' (1.524 m), weight 217 lb (98.431 kg), last menstrual period 09/11/2013.   JDY:NXGZFPOI: 140 bpm Toco: None SVE:   A/P- 28 y.o. admitted with:  PTL.  Stable.  Continue bedrest.  Present on Admission:  **None**  Pregnancy Complications: preterm labor   Preterm labor management: pelvic rest advised and modified bedrest advised Dating:  [redacted]w[redacted]d PNL Needed:  glucose FWB:  good PTL:  stable ROD: Repeat C/S

## 2014-05-16 NOTE — Progress Notes (Signed)
Patient ID: Samantha Lee, female   DOB: [birth year], 28 y.o.   MRN: [id number] Hospital Day: 4  S: Preterm labor symptoms: Cramping.  O: Blood pressure 126/65, pulse 94, temperature 98.5 F (36.9 C), temperature source Oral, resp. rate 18, height 5' (1.524 m), weight 217 lb (98.431 kg), last menstrual period 09/11/2013.   SFS:ELTRVUYE: 145 bpm Toco: Irregular, mild UC's SVE:   A/P- 28 y.o. admitted with:  UC's.  Stable.  Continue bedrest.  Present on Admission:  **None**  Pregnancy Complications: preterm labor   Preterm labor management: bedrest advised Dating:  [redacted]w[redacted]d PNL Needed:  none FWB:  good PTL:  stable

## 2014-05-16 NOTE — Discharge Summary (Signed)
Physician Discharge Summary  Patient ID: [name] MRN: [id number] DOB/AGE: August 16, 1985 28 y.o.  Admit date: 05/13/2014 Discharge date: 05/16/2014  Admission Diagnoses:  Preterm UC's  Discharge Diagnoses: Same Active Problems:   Hx of PTL (preterm labor), current pregnancy   Discharged Condition: good  Hospital Course: Admitted with UC's but not in labor.  Responded well to bedrest.  Discharged home in good condition, undelivered, at 35 weeks.  Consults: None  Significant Diagnostic Studies: EFM  Treatments: IV hydration  Discharge Exam: Blood pressure 127/72, pulse 97, temperature 98 F (36.7 C), temperature source Oral, resp. rate 20, height 5' (1.524 m), weight 217 lb (98.431 kg), last menstrual period 09/11/2013. General appearance: alert and no distress Resp: clear to auscultation bilaterally Cardio: regular rate and rhythm, S1, S2 normal, no murmur, click, rub or gallop GI: normal findings: soft, non-tender  Disposition: 01-Home or Self Care  Discharge Instructions    Discharge activity:    Complete by:  As directed      Discharge diet:    Complete by:  As directed      Do not have sex or do anything that might make you have an orgasm    Complete by:  As directed      Notify physician for a general feeling that "something is not right"    Complete by:  As directed      Notify physician for increase or change in vaginal discharge    Complete by:  As directed      Notify physician for intestinal cramps, with or without diarrhea, sometimes described as "gas pain"    Complete by:  As directed      Notify physician for leaking of fluid    Complete by:  As directed      Notify physician for low, dull backache, unrelieved by heat or Tylenol    Complete by:  As directed      Notify physician for menstrual like cramps    Complete by:  As directed      Notify physician for pelvic pressure    Complete by:  As directed      Notify physician for uterine  contractions.  These may be painless and feel like the uterus is tightening or the baby is  "balling up"    Complete by:  As directed      Notify physician for vaginal bleeding    Complete by:  As directed      PRETERM LABOR:  Includes any of the follwing symptoms that occur between 20 - [redacted] weeks gestation.  If these symptoms are not stopped, preterm labor can result in preterm delivery, placing your baby at risk    Complete by:  As directed             Medication List    TAKE these medications        prenatal multivitamin Tabs tablet  Take 1 tablet by mouth daily.           Follow-up Information    Follow up with Lahoma Crocker A, MD In 1 week.   Specialty:  Obstetrics and Gynecology   Contact information:   [address] [place] [address] [phone]       Signed: Shelly Bombard 05/16/2014, 3:01 PM

## 2014-05-17 ENCOUNTER — Telehealth: Payer: Self-pay

## 2014-05-17 NOTE — Telephone Encounter (Signed)
let patient know her medical records were sent to [facility] today

## 2014-05-20 ENCOUNTER — Ambulatory Visit (INDEPENDENT_AMBULATORY_CARE_PROVIDER_SITE_OTHER): Payer: 59 | Admitting: Obstetrics & Gynecology

## 2014-05-20 ENCOUNTER — Encounter: Payer: Self-pay | Admitting: Obstetrics & Gynecology

## 2014-05-20 VITALS — BP 112/78 | HR 96 | Temp 98.2°F | Wt 216.0 lb

## 2014-05-20 DIAGNOSIS — Z3483 Encounter for supervision of other normal pregnancy, third trimester: Secondary | ICD-10-CM

## 2014-05-20 DIAGNOSIS — R399 Unspecified symptoms and signs involving the genitourinary system: Secondary | ICD-10-CM

## 2014-05-20 LAB — POCT URINALYSIS DIPSTICK
Bilirubin, UA: NEGATIVE
Blood, UA: NEGATIVE
GLUCOSE UA: NEGATIVE
Nitrite, UA: NEGATIVE
SPEC GRAV UA: 1.02
UROBILINOGEN UA: NEGATIVE
pH, UA: 6

## 2014-05-20 MED ORDER — SULFAMETHOXAZOLE-TRIMETHOPRIM 800-160 MG PO TABS: 1.0000 | ORAL_TABLET | Freq: Two times a day (BID) | ORAL | Status: DC

## 2014-05-22 LAB — CULTURE, OB URINE: Colony Count: 7000

## 2014-05-22 LAB — STREP B DNA PROBE: GBSP: NOT DETECTED

## 2014-05-23 NOTE — Progress Notes (Signed)
Subjective:    Samantha Lee is a 28 y.o. female being seen today for her obstetrical visit. She is at [redacted]w[redacted]d gestation. Patient reports worsening contractions. Fetal movement: normal.  CBGs in range  Problem List Items Addressed This Visit    Supervision of other normal pregnancy - Primary   Relevant Orders      POCT urinalysis dipstick (Completed)      Strep B DNA probe (Completed)    Other Visit Diagnoses    UTI symptoms        Relevant Medications       sulfamethoxazole-trimethoprim (BACTRIM/SEPTRA DS) tablet 800-160 mg    Other Relevant Orders       Culture, OB Urine (Completed)      Patient Active Problem List   Diagnosis Date Noted  . Gestational diabetes 03/20/2014    Priority: High  . Previous cesarean section 12/07/2013    Priority: High  . Supervision of other normal pregnancy 12/07/2013    Priority: High  . Pelvic pain during pregnancy in third trimester, antepartum   . Threatened preterm labor 05/08/2014  . Hx of PTL (preterm labor), current pregnancy 05/06/2014  . Endometriosis 01/28/2013  . Routine gynecological examination 11/28/2012  . Unspecified symptom associated with female genital organs 11/28/2012   Objective:    BP 112/78 mmHg  Pulse 96  Temp(Src) 98.2 F (36.8 C)  Wt 97.977 kg (216 lb)  LMP 09/11/2013 FHT:  140 BPM  Uterine Size: size equals dates  Presentation: cephalic     Assessment:    Pregnancy @ [redacted]w[redacted]d weeks     Plan:   Orders Placed This Encounter  Procedures  . Culture, OB Urine  . Strep B DNA probe  . POCT urinalysis dipstick     labs reviewed, problem list updated   Follow up in 1 Week.

## 2014-05-23 NOTE — Patient Instructions (Signed)

## 2014-05-25 ENCOUNTER — Encounter (HOSPITAL_COMMUNITY): Payer: Self-pay | Admitting: *Deleted

## 2014-05-25 ENCOUNTER — Inpatient Hospital Stay (HOSPITAL_COMMUNITY)
Admission: AD | Admit: 2014-05-25 | Discharge: 2014-05-25 | Disposition: A | Discharge status: Home / Self Care | Payer: 59 | Source: Ambulatory Visit | Attending: Obstetrics | Admitting: Obstetrics

## 2014-05-25 ENCOUNTER — Other Ambulatory Visit: Payer: Self-pay | Admitting: *Deleted

## 2014-05-25 ENCOUNTER — Telehealth: Payer: Self-pay | Admitting: *Deleted

## 2014-05-25 DIAGNOSIS — O3421 Maternal care for scar from previous cesarean delivery: Secondary | ICD-10-CM | POA: Diagnosis not present

## 2014-05-25 DIAGNOSIS — Z98891 History of uterine scar from previous surgery: Secondary | ICD-10-CM

## 2014-05-25 DIAGNOSIS — Z3A36 36 weeks gestation of pregnancy: Secondary | ICD-10-CM | POA: Diagnosis not present

## 2014-05-25 DIAGNOSIS — O24419 Gestational diabetes mellitus in pregnancy, unspecified control: Secondary | ICD-10-CM

## 2014-05-25 DIAGNOSIS — O9989 Other specified diseases and conditions complicating pregnancy, childbirth and the puerperium: Secondary | ICD-10-CM | POA: Insufficient documentation

## 2014-05-25 LAB — URINALYSIS, ROUTINE W REFLEX MICROSCOPIC
Bilirubin Urine: NEGATIVE
GLUCOSE, UA: 500 mg/dL — AB
HGB URINE DIPSTICK: NEGATIVE
Ketones, ur: 15 mg/dL — AB
Leukocytes, UA: NEGATIVE
Nitrite: NEGATIVE
PROTEIN: NEGATIVE mg/dL
Specific Gravity, Urine: >1.03 — ABNORMAL HIGH (ref 1.005–1.030)
Urobilinogen, UA: 1 mg/dL (ref 0.0–1.0)
pH: 6.5 (ref 5.0–8.0)

## 2014-05-25 MED ORDER — NIFEDIPINE 10 MG PO CAPS
10.0000 mg | ORAL_CAPSULE | Freq: Once | ORAL | Status: AC
  Administered 2014-05-25: 10 mg via ORAL
  Filled 2014-05-25: qty 1

## 2014-05-25 NOTE — MAU Provider Note (Signed)
History     CSN: [id number]  Arrival date and time: 05/25/14 1202   None     Chief Complaint  Patient presents with  . Labor Eval  . Rupture of Membranes   HPI pt is [redacted]w[redacted]d pregnant and presents with contractions since 5 am every 2 to 3 minutes then 4 to 5 minutes and have returned to 2 to 3 minutes getting worse .  Pt had leakage of fluid yesterday at one time, didn't think it Last ate at 11 am- sausage biscuit with jelly and had diet lemonade  Registered Nurse Addendum Nursing MAU Note 05/25/2014 12:18 PM    Expand All Collapse All   Patient states she woke up yesterday with a puddle of clear fluid in the bed. Has not leaked anything since that time. States she has been having contractions since 0500 this am. Reports good fetal movement. Was told by the office to come to MAU for evaluation of rupture of membranes and preterm labor. Patient is scheduled for a repeat cesarean section on 06-16-14       Past Medical History  Diagnosis Date  . Headache(784.0)     otc med prn  . Endometriosis   . Preterm labor   . Gestational diabetes     Past Surgical History  Procedure Laterality Date  . Cesarean section      x 2  . Dilation and curettage of uterus      mab  . Laparoscopy N/A 01/15/2013    Procedure: LAPAROSCOPY DIAGNOSTIC  ;  Surgeon: Lahoma Crocker, MD;  Location: [facility];  Service: Gynecology;  Laterality: N/A;  . Laparoscopic lysis of adhesions N/A 01/15/2013    Procedure: LAPAROSCOPIC LYSIS OF ADHESIONS;  Surgeon: Lahoma Crocker, MD;  Location: [facility];  Service: Gynecology;  Laterality: N/A;  with ablation of endometriotic implants   . Laparoscopy Right 01/15/2013    Procedure: LAPAROSCOPY OPERATIVE  ;  Surgeon: Lahoma Crocker, MD;  Location: [facility];  Service: Gynecology;  Laterality: Right;  R sidewall biopsy    Family History  Problem Relation Age of Onset  . Hypertension Mother     History  Substance Use Topics  . Smoking status: Never Smoker   .  Smokeless tobacco: Never Used  . Alcohol Use: No    Allergies: No Known Allergies  Prescriptions prior to admission  Medication Sig Dispense Refill Last Dose  . Prenatal Vit-Fe Fumarate-FA (PRENATAL MULTIVITAMIN) TABS tablet Take 1 tablet by mouth daily.    Taking  . sulfamethoxazole-trimethoprim (BACTRIM DS,SEPTRA DS) 800-160 MG per tablet Take 1 tablet by mouth 2 (two) times daily. 14 tablet 0     ROS Physical Exam   Blood pressure 117/82, pulse 95, temperature 98.4 F (36.9 C), temperature source Oral, resp. rate 18, last menstrual period 09/11/2013, SpO2 97 %.  Physical Exam  Vitals reviewed. Constitutional: She appears well-developed and well-nourished. No distress.  HENT:  Head: Normocephalic.  Eyes: Pupils are equal, round, and reactive to light.  Neck: Normal range of motion.  Cardiovascular: Normal rate.   Respiratory: Effort normal.  GI: Soft.  Genitourinary:  Small amount of creamy white discharge in vault; cervix posterior 3 cm difficult to feel  Musculoskeletal: Normal range of motion.  Neurological: She is alert.  Skin: Skin is warm and dry.  Psychiatric: She has a normal mood and affect.    MAU Course  Procedures  Results for orders placed or performed during the hospital encounter of 05/25/14 (from the past 24 hour(s))  Urinalysis, Routine w reflex microscopic     Status: Abnormal   Collection Time: 05/25/14  1:50 PM  Result Value Ref Range   Color, Urine YELLOW YELLOW   APPearance CLEAR CLEAR   Specific Gravity, Urine >1.030 (H) 1.005 - 1.030   pH 6.5 5.0 - 8.0   Glucose, UA 500 (A) NEGATIVE mg/dL   Hgb urine dipstick NEGATIVE NEGATIVE   Bilirubin Urine NEGATIVE NEGATIVE   Ketones, ur 15 (A) NEGATIVE mg/dL   Protein, ur NEGATIVE NEGATIVE mg/dL   Urobilinogen, UA 1.0 0.0 - 1.0 mg/dL   Nitrite NEGATIVE NEGATIVE   Leukocytes, UA NEGATIVE NEGATIVE  fern per RN Care handed over to RN for labor eval Assessment and Plan     Samantha Lee 05/25/2014, 1:13 PM

## 2014-05-25 NOTE — MAU Note (Addendum)
Patient states she woke up yesterday with a puddle of clear fluid in the bed. Has not leaked anything since that time. States she has been having contractions since 0500 this am. Reports good fetal movement. Was told by the office to come to MAU for evaluation of rupture of membranes and preterm labor. Patient is scheduled for a repeat cesarean section on 06-16-14.

## 2014-05-25 NOTE — Telephone Encounter (Signed)
Patient called to reports contractions- patient states she woke in a "puddle" yesterday am- she was not concerned- but she started having more frequent contractions and back pain. Patient is scheduled for C section on 06/19/2014. Patient advised to go over to MAU for evaluation of fluid and monitor. Dr Jodi Mourning notified.

## 2014-05-25 NOTE — Discharge Instructions (Signed)

## 2014-05-26 ENCOUNTER — Ambulatory Visit (INDEPENDENT_AMBULATORY_CARE_PROVIDER_SITE_OTHER): Payer: 59 | Admitting: Obstetrics & Gynecology

## 2014-05-26 ENCOUNTER — Encounter: Payer: Self-pay | Admitting: Obstetrics & Gynecology

## 2014-05-26 VITALS — BP 125/89 | HR 85 | Temp 98.1°F | Wt 218.0 lb

## 2014-05-26 DIAGNOSIS — Z3483 Encounter for supervision of other normal pregnancy, third trimester: Secondary | ICD-10-CM

## 2014-05-26 LAB — POCT URINALYSIS DIPSTICK
BILIRUBIN UA: NEGATIVE
Glucose, UA: NEGATIVE
KETONES UA: NEGATIVE
Nitrite, UA: NEGATIVE
RBC UA: NEGATIVE
Spec Grav, UA: 1.015
Urobilinogen, UA: NEGATIVE
pH, UA: 6

## 2014-05-28 NOTE — Progress Notes (Signed)
Subjective:    Samantha Lee is a 28 y.o. female being seen today for her obstetrical visit. She is at [redacted]w[redacted]d gestation. Patient reports worsening contractions. Fetal movement: normal.  CBGs in range  Problem List Items Addressed This Visit    Supervision of other normal pregnancy - Primary   Relevant Orders      POCT urinalysis dipstick (Completed)     Patient Active Problem List   Diagnosis Date Noted  . Gestational diabetes 03/20/2014    Priority: High  . Previous cesarean section 12/07/2013    Priority: High  . Supervision of other normal pregnancy 12/07/2013    Priority: High  . Pelvic pain during pregnancy in third trimester, antepartum   . Threatened preterm labor 05/08/2014  . Hx of PTL (preterm labor), current pregnancy 05/06/2014  . Endometriosis 01/28/2013  . Routine gynecological examination 11/28/2012  . Unspecified symptom associated with female genital organs 11/28/2012   Objective:    BP 125/89 mmHg  Pulse 85  Temp(Src) 98.1 F (36.7 C)  Wt 98.884 kg (218 lb)  LMP 09/11/2013 FHT:  140 BPM  Uterine Size: size equals dates  Presentation: cephalic     Assessment:    Pregnancy @ [redacted]w[redacted]d weeks     Plan:   Orders Placed This Encounter  Procedures  . POCT urinalysis dipstick     labs reviewed, problem list updated Start NSTs next week  Follow up in 1 Week.

## 2014-05-28 NOTE — Patient Instructions (Signed)
Nonstress Test The nonstress test is a procedure that monitors the fetus's heartbeat. The test will monitor the heartbeat when the fetus is at rest and while the fetus is moving. In a healthy fetus, there will be an increase in fetal heart rate when the fetus moves or kicks. The heart rate will decrease at rest. This test helps determine if the fetus is healthy. Your health care provider will look at a number of patterns in the heart rate tracing to make sure your baby is thriving. If there is concern, your health care provider may order additional tests or may suggest another course of action. This test is often done in the third trimester and can help determine if an early delivery is needed and safe. Common reasons to have this test are:  You are past your due date.  You have a high-risk pregnancy.  You are feeling less movement than normal.  You have lost a pregnancy in the past.  Your health care provider suspects fetal growth problems.  You have too much or too little amniotic fluid. BEFORE THE PROCEDURE  Eat a meal right before the test or as directed by your health care provider. Food may help stimulate fetal movements.  Use the restroom right before the test. PROCEDURE  Two belts will be placed around your abdomen. These belts have monitors attached to them. One records the fetal heart rate and the other records uterine contractions.  You may be asked to lie down on your side or to stay sitting upright.  You may be given a button to press when you feel movement.  The fetal heartbeat is listened to and watched on a screen. The heartbeat is recorded on a sheet of paper.  If the fetus seems to be sleeping, you may be asked to drink some juice or soda, gently press your abdomen, or make some noise to wake the fetus. AFTER THE PROCEDURE  Your health care provider will discuss the test results with you and make recommendations for the near future. Document Released: 05/11/2002  Document Revised: 10/05/2013 Document Reviewed: 06/24/2012 [facility] Patient Information 2015 [place], [place]. This information is not intended to replace advice given to you by your health care provider. Make sure you discuss any questions you have with your health care provider.

## 2014-05-29 ENCOUNTER — Inpatient Hospital Stay (HOSPITAL_COMMUNITY)
Admission: AD | Admit: 2014-05-29 | Discharge: 2014-06-01 | DRG: 766 | Disposition: A | Discharge status: Home / Self Care | Payer: 59 | Source: Ambulatory Visit | Attending: Obstetrics & Gynecology | Admitting: Obstetrics & Gynecology

## 2014-05-29 ENCOUNTER — Inpatient Hospital Stay (HOSPITAL_COMMUNITY): Payer: 59 | Admitting: Anesthesiology

## 2014-05-29 ENCOUNTER — Encounter (HOSPITAL_COMMUNITY): Payer: Self-pay

## 2014-05-29 ENCOUNTER — Encounter (HOSPITAL_COMMUNITY)
Admission: AD | Disposition: A | Discharge status: Home / Self Care | Payer: Self-pay | Source: Ambulatory Visit | Attending: Obstetrics & Gynecology

## 2014-05-29 DIAGNOSIS — O24420 Gestational diabetes mellitus in childbirth, diet controlled: Secondary | ICD-10-CM | POA: Diagnosis present

## 2014-05-29 DIAGNOSIS — O479 False labor, unspecified: Secondary | ICD-10-CM

## 2014-05-29 DIAGNOSIS — O9902 Anemia complicating childbirth: Secondary | ICD-10-CM | POA: Diagnosis not present

## 2014-05-29 DIAGNOSIS — O3421 Maternal care for scar from previous cesarean delivery: Principal | ICD-10-CM | POA: Diagnosis present

## 2014-05-29 DIAGNOSIS — Z98891 History of uterine scar from previous surgery: Secondary | ICD-10-CM

## 2014-05-29 DIAGNOSIS — Z3A37 37 weeks gestation of pregnancy: Secondary | ICD-10-CM | POA: Diagnosis present

## 2014-05-29 DIAGNOSIS — D649 Anemia, unspecified: Secondary | ICD-10-CM | POA: Diagnosis present

## 2014-05-29 DIAGNOSIS — O24419 Gestational diabetes mellitus in pregnancy, unspecified control: Secondary | ICD-10-CM

## 2014-05-29 DIAGNOSIS — Z3483 Encounter for supervision of other normal pregnancy, third trimester: Secondary | ICD-10-CM | POA: Diagnosis present

## 2014-05-29 HISTORY — DX: Other specified postprocedural states: Z98.890

## 2014-05-29 HISTORY — DX: Other specified postprocedural states: R11.2

## 2014-05-29 LAB — CBC
HEMATOCRIT: 33.1 % — AB (ref 36.0–46.0)
HEMOGLOBIN: 11.1 g/dL — AB (ref 12.0–15.0)
MCH: 29.8 pg (ref 26.0–34.0)
MCHC: 33.5 g/dL (ref 30.0–36.0)
MCV: 89 fL (ref 78.0–100.0)
Platelets: 247 10*3/uL (ref 150–400)
RBC: 3.72 MIL/uL — AB (ref 3.87–5.11)
RDW: 14.4 % (ref 11.5–15.5)
WBC: 9.1 10*3/uL (ref 4.0–10.5)

## 2014-05-29 LAB — URINALYSIS, ROUTINE W REFLEX MICROSCOPIC
Bilirubin Urine: NEGATIVE
GLUCOSE, UA: NEGATIVE mg/dL
HGB URINE DIPSTICK: NEGATIVE
KETONES UR: 15 mg/dL — AB
Nitrite: NEGATIVE
PH: 6.5 (ref 5.0–8.0)
Protein, ur: NEGATIVE mg/dL
Urobilinogen, UA: 1 mg/dL (ref 0.0–1.0)

## 2014-05-29 LAB — TYPE AND SCREEN
ABO/RH(D): O POS
Antibody Screen: NEGATIVE

## 2014-05-29 LAB — URINE MICROSCOPIC-ADD ON

## 2014-05-29 LAB — GLUCOSE, CAPILLARY: GLUCOSE-CAPILLARY: 179 mg/dL — AB (ref 70–99)

## 2014-05-29 SURGERY — Surgical Case
Anesthesia: Spinal | Site: Abdomen

## 2014-05-29 MED ORDER — MORPHINE SULFATE (PF) 0.5 MG/ML IJ SOLN
INTRAMUSCULAR | Status: DC | PRN
  Administered 2014-05-29: .15 mg via INTRATHECAL

## 2014-05-29 MED ORDER — OXYCODONE-ACETAMINOPHEN 5-325 MG PO TABS
2.0000 | ORAL_TABLET | ORAL | Status: DC | PRN
  Administered 2014-05-30 – 2014-06-01 (×7): 2 via ORAL
  Filled 2014-05-29 (×7): qty 2

## 2014-05-29 MED ORDER — OXYTOCIN 10 UNIT/ML IJ SOLN
INTRAMUSCULAR | Status: AC
  Filled 2014-05-29: qty 4

## 2014-05-29 MED ORDER — DIPHENHYDRAMINE HCL 25 MG PO CAPS: 25.0000 mg | ORAL_CAPSULE | ORAL | Status: DC | PRN

## 2014-05-29 MED ORDER — MORPHINE SULFATE 0.5 MG/ML IJ SOLN
INTRAMUSCULAR | Status: AC
  Filled 2014-05-29: qty 10

## 2014-05-29 MED ORDER — PRENATAL MULTIVITAMIN CH
1.0000 | ORAL_TABLET | Freq: Every day | ORAL | Status: DC
  Administered 2014-05-30 – 2014-06-01 (×3): 1 via ORAL
  Filled 2014-05-29 (×3): qty 1

## 2014-05-29 MED ORDER — MEPERIDINE HCL 25 MG/ML IJ SOLN: 6.2500 mg | INTRAMUSCULAR | Status: DC | PRN

## 2014-05-29 MED ORDER — CEFAZOLIN SODIUM-DEXTROSE 2-3 GM-% IV SOLR
2.0000 g | INTRAVENOUS | Status: AC
  Administered 2014-05-29: 2 g via INTRAVENOUS
  Filled 2014-05-29 (×2): qty 50

## 2014-05-29 MED ORDER — KETOROLAC TROMETHAMINE 30 MG/ML IJ SOLN: 30.0000 mg | Freq: Four times a day (QID) | INTRAMUSCULAR | Status: DC | PRN

## 2014-05-29 MED ORDER — LACTATED RINGERS IV SOLN
INTRAVENOUS | Status: DC | PRN
  Administered 2014-05-29 (×2): via INTRAVENOUS

## 2014-05-29 MED ORDER — DEXTROSE 5 % IV SOLN
1.0000 ug/kg/h | INTRAVENOUS | Status: DC | PRN
  Filled 2014-05-29: qty 2

## 2014-05-29 MED ORDER — SCOPOLAMINE 1 MG/3DAYS TD PT72
MEDICATED_PATCH | TRANSDERMAL | Status: AC
  Filled 2014-05-29: qty 1

## 2014-05-29 MED ORDER — WITCH HAZEL-GLYCERIN EX PADS: 1.0000 "application " | MEDICATED_PAD | CUTANEOUS | Status: DC | PRN

## 2014-05-29 MED ORDER — HYDROMORPHONE HCL 1 MG/ML IJ SOLN
0.2500 mg | INTRAMUSCULAR | Status: DC | PRN
  Administered 2014-05-29 (×2): 0.5 mg via INTRAVENOUS

## 2014-05-29 MED ORDER — CITRIC ACID-SODIUM CITRATE 334-500 MG/5ML PO SOLN
30.0000 mL | Freq: Once | ORAL | Status: AC
  Administered 2014-05-29: 30 mL via ORAL
  Filled 2014-05-29: qty 15

## 2014-05-29 MED ORDER — FENTANYL CITRATE 0.05 MG/ML IJ SOLN
INTRAMUSCULAR | Status: DC | PRN
  Administered 2014-05-29 (×4): 25 ug via INTRAVENOUS

## 2014-05-29 MED ORDER — OXYTOCIN 40 UNITS IN LACTATED RINGERS INFUSION - SIMPLE MED: 62.5000 mL/h | INTRAVENOUS | Status: AC

## 2014-05-29 MED ORDER — SCOPOLAMINE 1 MG/3DAYS TD PT72: 1.0000 | MEDICATED_PATCH | Freq: Once | TRANSDERMAL | Status: DC

## 2014-05-29 MED ORDER — PHENYLEPHRINE 8 MG IN D5W 100 ML (0.08MG/ML) PREMIX OPTIME
INJECTION | INTRAVENOUS | Status: DC | PRN
  Administered 2014-05-29: 60 ug/min via INTRAVENOUS

## 2014-05-29 MED ORDER — SIMETHICONE 80 MG PO CHEW
80.0000 mg | CHEWABLE_TABLET | ORAL | Status: DC | PRN
  Administered 2014-05-31: 80 mg via ORAL
  Filled 2014-05-29: qty 1

## 2014-05-29 MED ORDER — SCOPOLAMINE 1 MG/3DAYS TD PT72
MEDICATED_PATCH | TRANSDERMAL | Status: DC | PRN
  Administered 2014-05-29: 1 via TRANSDERMAL

## 2014-05-29 MED ORDER — HYDROMORPHONE HCL 1 MG/ML IJ SOLN
INTRAMUSCULAR | Status: AC
  Filled 2014-05-29: qty 1

## 2014-05-29 MED ORDER — SODIUM CHLORIDE 0.9 % IJ SOLN: 3.0000 mL | INTRAMUSCULAR | Status: DC | PRN

## 2014-05-29 MED ORDER — FAMOTIDINE IN NACL 20-0.9 MG/50ML-% IV SOLN
20.0000 mg | Freq: Once | INTRAVENOUS | Status: AC
  Administered 2014-05-29: 20 mg via INTRAVENOUS
  Filled 2014-05-29: qty 50

## 2014-05-29 MED ORDER — DIPHENHYDRAMINE HCL 25 MG PO CAPS: 25.0000 mg | ORAL_CAPSULE | Freq: Four times a day (QID) | ORAL | Status: DC | PRN

## 2014-05-29 MED ORDER — MEPERIDINE HCL 25 MG/ML IJ SOLN
INTRAMUSCULAR | Status: AC
  Filled 2014-05-29: qty 1

## 2014-05-29 MED ORDER — IBUPROFEN 600 MG PO TABS
600.0000 mg | ORAL_TABLET | Freq: Four times a day (QID) | ORAL | Status: DC
  Administered 2014-05-29 – 2014-06-01 (×12): 600 mg via ORAL
  Filled 2014-05-29 (×12): qty 1

## 2014-05-29 MED ORDER — BUPIVACAINE IN DEXTROSE 0.75-8.25 % IT SOLN
INTRATHECAL | Status: DC | PRN
  Administered 2014-05-29: 1.6 mL via INTRATHECAL

## 2014-05-29 MED ORDER — LACTATED RINGERS IV BOLUS (SEPSIS)
1000.0000 mL | Freq: Once | INTRAVENOUS | Status: AC
  Administered 2014-05-29: 1000 mL via INTRAVENOUS

## 2014-05-29 MED ORDER — NALBUPHINE HCL 10 MG/ML IJ SOLN: 5.0000 mg | Freq: Once | INTRAMUSCULAR | Status: DC | PRN

## 2014-05-29 MED ORDER — FERROUS SULFATE 325 (65 FE) MG PO TABS
325.0000 mg | ORAL_TABLET | Freq: Two times a day (BID) | ORAL | Status: DC
  Administered 2014-05-30 – 2014-06-01 (×6): 325 mg via ORAL
  Filled 2014-05-29 (×6): qty 1

## 2014-05-29 MED ORDER — NALBUPHINE HCL 10 MG/ML IJ SOLN: 5.0000 mg | INTRAMUSCULAR | Status: DC | PRN

## 2014-05-29 MED ORDER — PROMETHAZINE HCL 25 MG/ML IJ SOLN: 6.2500 mg | INTRAMUSCULAR | Status: DC | PRN

## 2014-05-29 MED ORDER — 0.9 % SODIUM CHLORIDE (POUR BTL) OPTIME
TOPICAL | Status: DC | PRN
  Administered 2014-05-29: 1000 mL

## 2014-05-29 MED ORDER — DIBUCAINE 1 % RE OINT: 1.0000 "application " | TOPICAL_OINTMENT | RECTAL | Status: DC | PRN

## 2014-05-29 MED ORDER — TETANUS-DIPHTH-ACELL PERTUSSIS 5-2.5-18.5 LF-MCG/0.5 IM SUSP: 0.5000 mL | Freq: Once | INTRAMUSCULAR | Status: DC

## 2014-05-29 MED ORDER — ZOLPIDEM TARTRATE 5 MG PO TABS: 5.0000 mg | ORAL_TABLET | Freq: Every evening | ORAL | Status: DC | PRN

## 2014-05-29 MED ORDER — ONDANSETRON HCL 4 MG/2ML IJ SOLN
INTRAMUSCULAR | Status: AC
  Filled 2014-05-29: qty 2

## 2014-05-29 MED ORDER — LANOLIN HYDROUS EX OINT: 1.0000 "application " | TOPICAL_OINTMENT | CUTANEOUS | Status: DC | PRN

## 2014-05-29 MED ORDER — LACTATED RINGERS IV SOLN
INTRAVENOUS | Status: DC
  Administered 2014-05-29: via INTRAVENOUS

## 2014-05-29 MED ORDER — FENTANYL CITRATE 0.05 MG/ML IJ SOLN
INTRAMUSCULAR | Status: AC
  Filled 2014-05-29: qty 2

## 2014-05-29 MED ORDER — OXYTOCIN 10 UNIT/ML IJ SOLN
40.0000 [IU] | INTRAMUSCULAR | Status: DC | PRN
  Administered 2014-05-29: 40 [IU] via INTRAVENOUS

## 2014-05-29 MED ORDER — PROMETHAZINE HCL 25 MG/ML IJ SOLN
12.5000 mg | Freq: Four times a day (QID) | INTRAMUSCULAR | Status: DC | PRN
  Administered 2014-05-29: 12.5 mg via INTRAVENOUS
  Filled 2014-05-29: qty 1

## 2014-05-29 MED ORDER — ONDANSETRON HCL 4 MG PO TABS: 4.0000 mg | ORAL_TABLET | ORAL | Status: DC | PRN

## 2014-05-29 MED ORDER — NALOXONE HCL 0.4 MG/ML IJ SOLN
0.4000 mg | INTRAMUSCULAR | Status: DC | PRN
Start: 2014-05-29 — End: 2014-06-01

## 2014-05-29 MED ORDER — MAGNESIUM HYDROXIDE 400 MG/5ML PO SUSP: 30.0000 mL | ORAL | Status: DC | PRN

## 2014-05-29 MED ORDER — DEXTROSE 5 % IV SOLN
500.0000 mg | INTRAVENOUS | Status: AC
  Administered 2014-05-29: 500 mg via INTRAVENOUS
  Filled 2014-05-29: qty 500

## 2014-05-29 MED ORDER — KETOROLAC TROMETHAMINE 30 MG/ML IJ SOLN
30.0000 mg | Freq: Four times a day (QID) | INTRAMUSCULAR | Status: DC | PRN
  Administered 2014-05-29: 30 mg via INTRAVENOUS

## 2014-05-29 MED ORDER — MEASLES, MUMPS & RUBELLA VAC ~~LOC~~ INJ
0.5000 mL | INJECTION | Freq: Once | SUBCUTANEOUS | Status: DC
  Filled 2014-05-29: qty 0.5

## 2014-05-29 MED ORDER — ONDANSETRON HCL 4 MG/2ML IJ SOLN
INTRAMUSCULAR | Status: DC | PRN
  Administered 2014-05-29: 4 mg via INTRAVENOUS

## 2014-05-29 MED ORDER — KETOROLAC TROMETHAMINE 30 MG/ML IJ SOLN
INTRAMUSCULAR | Status: AC
  Filled 2014-05-29: qty 1

## 2014-05-29 MED ORDER — PHENYLEPHRINE 8 MG IN D5W 100 ML (0.08MG/ML) PREMIX OPTIME
INJECTION | INTRAVENOUS | Status: AC
  Filled 2014-05-29: qty 100

## 2014-05-29 MED ORDER — ONDANSETRON HCL 4 MG/2ML IJ SOLN
4.0000 mg | INTRAMUSCULAR | Status: DC | PRN
  Administered 2014-05-29: 4 mg via INTRAVENOUS
  Filled 2014-05-29: qty 2

## 2014-05-29 MED ORDER — BUTORPHANOL TARTRATE 1 MG/ML IJ SOLN
2.0000 mg | Freq: Once | INTRAMUSCULAR | Status: AC
Start: 2014-05-29 — End: 2014-05-29
  Administered 2014-05-29: 2 mg via INTRAVENOUS
  Filled 2014-05-29: qty 2

## 2014-05-29 MED ORDER — ONDANSETRON HCL 4 MG/2ML IJ SOLN: 4.0000 mg | Freq: Three times a day (TID) | INTRAMUSCULAR | Status: DC | PRN

## 2014-05-29 MED ORDER — LACTATED RINGERS IV SOLN
INTRAVENOUS | Status: DC | PRN
  Administered 2014-05-29: 11:00:00 via INTRAVENOUS

## 2014-05-29 MED ORDER — SENNOSIDES-DOCUSATE SODIUM 8.6-50 MG PO TABS
2.0000 | ORAL_TABLET | ORAL | Status: DC
  Administered 2014-05-31 (×2): 2 via ORAL
  Filled 2014-05-29 (×3): qty 2

## 2014-05-29 MED ORDER — SIMETHICONE 80 MG PO CHEW
80.0000 mg | CHEWABLE_TABLET | ORAL | Status: DC
  Administered 2014-05-31 (×2): 80 mg via ORAL
  Filled 2014-05-29 (×3): qty 1

## 2014-05-29 MED ORDER — DIPHENHYDRAMINE HCL 50 MG/ML IJ SOLN
12.5000 mg | INTRAMUSCULAR | Status: DC | PRN
Start: 2014-05-29 — End: 2014-06-01

## 2014-05-29 MED ORDER — OXYCODONE-ACETAMINOPHEN 5-325 MG PO TABS
1.0000 | ORAL_TABLET | ORAL | Status: DC | PRN
  Administered 2014-05-30 (×2): 1 via ORAL
  Filled 2014-05-29 (×2): qty 1

## 2014-05-29 SURGICAL SUPPLY — 44 items
BENZOIN TINCTURE PRP APPL 2/3 (GAUZE/BANDAGES/DRESSINGS) ×3 IMPLANT
CANISTER WOUND CARE 500ML ATS (WOUND CARE) IMPLANT
CLAMP CORD UMBIL (MISCELLANEOUS) IMPLANT
CLOSURE WOUND 1/2 X4 (GAUZE/BANDAGES/DRESSINGS) ×1
CLOTH BEACON ORANGE TIMEOUT ST (SAFETY) ×3 IMPLANT
CONTAINER PREFILL 10% NBF 15ML (MISCELLANEOUS) IMPLANT
DRAPE SHEET LG 3/4 BI-LAMINATE (DRAPES) IMPLANT
DRESSING DISP NPWT PICO 4X12 (MISCELLANEOUS) IMPLANT
DRSG OPSITE POSTOP 4X10 (GAUZE/BANDAGES/DRESSINGS) ×3 IMPLANT
DRSG VAC ATS LRG SENSATRAC (GAUZE/BANDAGES/DRESSINGS) IMPLANT
DRSG VAC ATS SM SENSATRAC (GAUZE/BANDAGES/DRESSINGS) IMPLANT
DURAPREP 26ML APPLICATOR (WOUND CARE) ×3 IMPLANT
ELECT REM PT RETURN 9FT ADLT (ELECTROSURGICAL) ×3
ELECTRODE REM PT RTRN 9FT ADLT (ELECTROSURGICAL) ×1 IMPLANT
EXTRACTOR VACUUM M CUP 4 TUBE (SUCTIONS) IMPLANT
EXTRACTOR VACUUM M CUP 4' TUBE (SUCTIONS)
GLOVE BIO SURGEON STRL SZ 6.5 (GLOVE) ×2 IMPLANT
GLOVE BIO SURGEONS STRL SZ 6.5 (GLOVE) ×1
GOWN STRL REUS W/TWL LRG LVL3 (GOWN DISPOSABLE) ×6 IMPLANT
KIT ABG SYR 3ML LUER SLIP (SYRINGE) IMPLANT
NEEDLE HYPO 25X5/8 SAFETYGLIDE (NEEDLE) ×3 IMPLANT
NS IRRIG 1000ML POUR BTL (IV SOLUTION) ×3 IMPLANT
PACK C SECTION WH (CUSTOM PROCEDURE TRAY) ×3 IMPLANT
PAD OB MATERNITY 4.3X12.25 (PERSONAL CARE ITEMS) ×3 IMPLANT
RETAINER VISCERAL (MISCELLANEOUS) ×3 IMPLANT
RTRCTR C-SECT PINK 25CM LRG (MISCELLANEOUS) ×3 IMPLANT
SCRUB PCMX 4 OZ (MISCELLANEOUS) ×3 IMPLANT
STAPLER VISISTAT 35W (STAPLE) IMPLANT
STRIP CLOSURE SKIN 1/2X4 (GAUZE/BANDAGES/DRESSINGS) ×2 IMPLANT
SUT MNCRL 0 VIOLET CTX 36 (SUTURE) ×3 IMPLANT
SUT MNCRL AB 3-0 PS2 27 (SUTURE) IMPLANT
SUT MONOCRYL 0 CTX 36 (SUTURE) ×6
SUT MONOCRYL 3-0 PS-2 UND MONO (SUTURE) ×3 IMPLANT
SUT PDS AB 0 CTX 36 PDP370T (SUTURE) ×6 IMPLANT
SUT PLAIN 0 NONE (SUTURE) IMPLANT
SUT VIC AB 0 CTXB 36 (SUTURE) IMPLANT
SUT VIC AB 2-0 CT1 (SUTURE) ×3 IMPLANT
SUT VIC AB 2-0 CT1 27 (SUTURE) ×2
SUT VIC AB 2-0 CT1 TAPERPNT 27 (SUTURE) ×1 IMPLANT
SUT VIC AB 2-0 SH 27 (SUTURE)
SUT VIC AB 2-0 SH 27XBRD (SUTURE) IMPLANT
TOWEL OR 17X24 6PK STRL BLUE (TOWEL DISPOSABLE) ×3 IMPLANT
TRAY FOLEY CATH 14FR (SET/KITS/TRAYS/PACK) ×3 IMPLANT
WATER STERILE IRR 1000ML POUR (IV SOLUTION) ×3 IMPLANT

## 2014-05-29 NOTE — Anesthesia Procedure Notes (Signed)
Spinal Patient location during procedure: OR Staffing Anesthesiologist: Nolon Nations R Performed by: anesthesiologist  Preanesthetic Checklist Completed: patient identified, site marked, surgical consent, pre-op evaluation, timeout performed, IV checked, risks and benefits discussed and monitors and equipment checked Spinal Block Patient position: sitting Prep: DuraPrep Patient monitoring: heart rate, continuous pulse ox and blood pressure Location: L3-4 Injection technique: single-shot Needle Needle type: Sprotte  Needle gauge: 24 G Needle length: 9 cm Assessment Sensory level: T6 Additional Notes Expiration date of kit checked and confirmed. Patient tolerated procedure well, without complications.

## 2014-05-29 NOTE — Progress Notes (Signed)
Up to BR after SVE.

## 2014-05-29 NOTE — Op Note (Signed)
Cesarean Section Procedure Note   CANDI PROFIT   05/29/2014  Indications: Threatened labor, history of cesarean deliveries x 2  Pre-operative Diagnosis:Intrauterine pregnancy at [redacted]w[redacted]d, history cesarean deliveries x 2, threatened labor, gestational diabetes--diet controlled Post-operative Diagnosis: Same   Surgeon: Lahoma Crocker A  Assistants: none  Anesthesia: spinal  Procedure Details:  The patient was seen in the Holding Room. The risks, benefits, complications, treatment options, and expected outcomes were discussed with the patient. The patient concurred with the proposed plan, giving informed consent. The patient was identified as Samantha Lee and the procedure verified as C-Section Delivery. A Time Out was held and the above information confirmed.  After induction of anesthesia, the patient was draped and prepped in the usual sterile manner. A transverse incision was made and carried down through the subcutaneous tissue to the fascia. The fascial incision was made and extended transversely. The fascia was separated from the underlying rectus tissue superiorly . The peritoneum was identified and entered. The peritoneal incision was extended longitudinally. An Alexis retractor was placed into the incision.  The utero-vesical peritoneal reflection was incised transversely and the bladder flap was sharply freed from the lower uterine segment. Omental adhesions to parieto peritoneum and uterine serosa were divided with the Bovie.  A low transverse uterine incision was made. Delivered from cephalic presentation was a  living newborn female infant. APGAR (1 MIN): 8   APGAR (5 MINS): 8       A cord ph was not sent. The umbilical cord was clamped and cut cord. A sample was obtained for evaluation. The placenta was removed Intact and appeared normal.  The uterine incision was closed with running locked sutures of 1-0 Monocryl. A second imbricating layer of the same suture was placed.   Hemostasis was observed. The omentum was inspected and found to be hemostatic.  The parieto peritoneum was closed in a running fashion with 2-0 Vicryl.  The fascia was then reapproximated with running sutures of 0 PDS.  A running suture of 2-0 Vicryl was placed in the subcutaneous layer.  The skin was closed with suture.  Instrument, sponge, and needle counts were correct prior the abdominal closure and were correct at the conclusion of the case.    Findings:  Omental adhesions   Estimated Blood Loss: 600 ml   Total IV Fluids: per Anesthesiology   Urine Output: per Anesthesiology  Specimens: Placenta to Pathology  Complications: no complications  Disposition: PACU - hemodynamically stable.  Maternal Condition: stable   Baby condition / location:  Couplet care / Skin to Skin    Signed: Surgeon(s): Lahoma Crocker, MD

## 2014-05-29 NOTE — H&P (Signed)
Samantha Lee is a 28 y.o. female presenting with contractions. Maternal Medical History:  Reason for admission: Contractions.   Contractions: Onset was 6-12 hours ago.   Frequency: regular.   Perceived severity is strong.    Fetal activity: Perceived fetal activity is normal.    Prenatal Complications - Diabetes: gestational. Diabetes is managed by diet.      OB History    Gravida Para Term Preterm AB TAB SAB Ectopic Multiple Living   4 3 2 1  0  0   2     Past Medical History  Diagnosis Date  . Headache(784.0)     otc med prn  . Endometriosis   . Preterm labor   . Gestational diabetes   . PONV (postoperative nausea and vomiting)    Past Surgical History  Procedure Laterality Date  . Cesarean section      x 2  . Dilation and curettage of uterus      mab  . Laparoscopy N/A 01/15/2013    Procedure: LAPAROSCOPY DIAGNOSTIC  ;  Surgeon: Lahoma Crocker, MD;  Location: [facility];  Service: Gynecology;  Laterality: N/A;  . Laparoscopic lysis of adhesions N/A 01/15/2013    Procedure: LAPAROSCOPIC LYSIS OF ADHESIONS;  Surgeon: Lahoma Crocker, MD;  Location: [facility];  Service: Gynecology;  Laterality: N/A;  with ablation of endometriotic implants   . Laparoscopy Right 01/15/2013    Procedure: LAPAROSCOPY OPERATIVE  ;  Surgeon: Lahoma Crocker, MD;  Location: [facility];  Service: Gynecology;  Laterality: Right;  R sidewall biopsy   Family History: family history includes Hypertension in her mother. Social History:  reports that she has never smoked. She has never used smokeless tobacco. She reports that she does not drink alcohol or use illicit drugs.     Review of Systems  Constitutional: Negative for fever.  Eyes: Negative for blurred vision.  Respiratory: Negative for shortness of breath.   Gastrointestinal: Negative for vomiting.  Skin: Negative for rash.  Neurological: Negative for headaches.    Dilation: 3.5 Effacement (%): 70 Station: -1 Exam by:: Blima Singer  rNC Blood pressure 137/74, pulse 84, temperature 98.4 F (36.9 C), temperature source Oral, resp. rate 18, height 5' (1.524 m), weight 100.336 kg (221 lb 3.2 oz), last menstrual period 09/11/2013, SpO2 96 %. Maternal Exam:  Uterine Assessment: Contraction frequency is regular.   Abdomen: not evaluated.  Introitus: not evaluated.   Cervix: Cervix evaluated by digital exam.     Fetal Exam Fetal Monitor Review: Baseline rate: 140.  Variability: moderate (6-25 bpm).   Pattern: no decelerations.    Fetal State Assessment: Category I - tracings are normal.     Physical Exam  Constitutional: She appears well-developed.  HENT:  Head: Normocephalic.  Neck: Neck supple. No thyromegaly present.  Cardiovascular: Normal rate and regular rhythm.   Respiratory: Breath sounds normal.  GI: Soft. Bowel sounds are normal.  Skin: No rash noted.    Prenatal labs: ABO, Rh: --/--/O POS (12/11 2207) Antibody: NEG (12/11 2207) Rubella: 1.99 (06/04 1725) RPR: NON REAC (10/15 1011)  HBsAg: NEGATIVE (06/04 1725)  HIV: NONREACTIVE (10/15 1011)  GBS: NOT DETECTED (12/17 1700)   Assessment/Plan: Multipara @ [redacted]w[redacted]d.  H/O C/D x 2.  Threatened labor.  GDM--diet controlled.  Category I FHT.  Admit Repeat C/D--informed consent obtained, questions answered to stated satisfaction   JACKSON-MOORE,Maddi Collar A 05/29/2014, 7:16 AM

## 2014-05-29 NOTE — Anesthesia Preprocedure Evaluation (Addendum)
Anesthesia Evaluation  Patient identified by MRN, date of birth, ID band Patient awake    Reviewed: Allergy & Precautions, H&P , Patient's Chart, lab work & pertinent test results, reviewed documented beta blocker date and time   History of Anesthesia Complications (+) PONVNegative for: history of anesthetic complications  Airway Mallampati: III  TM Distance: >3 FB Neck ROM: full    Dental no notable dental hx.    Pulmonary neg pulmonary ROS,  breath sounds clear to auscultation  Pulmonary exam normal       Cardiovascular Exercise Tolerance: Good negative cardio ROS  Rhythm:regular Rate:Normal     Neuro/Psych  Headaches, negative psych ROS   GI/Hepatic negative GI ROS, Neg liver ROS,   Endo/Other  diabetes, GestationalMorbid obesity  Renal/GU negative Renal ROS     Musculoskeletal   Abdominal   Peds  Hematology negative hematology ROS (+)   Anesthesia Other Findings   Reproductive/Obstetrics (+) Pregnancy                            Anesthesia Physical  Anesthesia Plan  ASA: III and emergent  Anesthesia Plan: Spinal   Post-op Pain Management:    Induction:   Airway Management Planned:   Additional Equipment: None  Intra-op Plan:   Post-operative Plan:   Informed Consent: I have reviewed the patients History and Physical, chart, labs and discussed the procedure including the risks, benefits and alternatives for the proposed anesthesia with the patient or authorized representative who has indicated his/her understanding and acceptance.   Dental advisory given  Plan Discussed with: CRNA  Anesthesia Plan Comments:        Anesthesia Quick Evaluation

## 2014-05-29 NOTE — MAU Note (Signed)
Some ctxs yesterday which stopped. Started back about 1.5hrs ago. Denies LOF or bleeding

## 2014-05-29 NOTE — Transfer of Care (Signed)
Immediate Anesthesia Transfer of Care Note  Patient: Samantha Lee  Procedure(s) Performed: Procedure(s): CESAREAN SECTION (N/A)  Patient Location: PACU  Anesthesia Type:Spinal  Level of Consciousness: awake, alert  and oriented  Airway & Oxygen Therapy: Patient Spontanous Breathing  Post-op Assessment: Report given to PACU RN and Post -op Vital signs reviewed and stable  Post vital signs: Reviewed and stable  Complications: No apparent anesthesia complications

## 2014-05-29 NOTE — Lactation Note (Signed)
This note was copied from the chart of Samantha Kerby Hockley. Lactation Consultation Note Initial visit at 52 hours of age.  Baby is in NICU due to low blood sugars.  Mom is recovering from c/s and PONV.  Mom was medicated and has been very drowsy.  MOm is just starting to wake up, MBU RN brought mom DEBP.  This LC instructed on set up, frequency and cleaning.  Encouraged mom to collect even just drops for NICU Baby.  NICU booklet given, but will need follow up teaching will be needed due to mom being so sleepy.  [facility] LC resources given and discussed.  Hand expression demonstrated with colostrum visible.  Mom to call for assist as needed.      Patient Name: Samantha Lee LKHVF'M Date: 05/29/2014 Reason for consult: Initial assessment;NICU baby   Maternal Data Has patient been taught Hand Expression?: Yes Does the patient have breastfeeding experience prior to this delivery?: Yes  Feeding    LATCH Score/Interventions                      Lactation Tools Discussed/Used Pump Review: Setup, frequency, and cleaning;Milk Storage Initiated by:: JS Date initiated:: 05/29/14   Consult Status Consult Status: Follow-up Date: 05/30/14 Follow-up type: In-patient    Justice Britain 05/29/2014, 10:12 PM

## 2014-05-29 NOTE — Anesthesia Postprocedure Evaluation (Signed)
Anesthesia Post Note  Patient: Samantha Lee  Procedure(s) Performed: Procedure(s) (LRB): CESAREAN SECTION (N/A)  Anesthesia type: Spinal  Patient location: PACU  Post pain: Pain level controlled  Post assessment: Post-op Vital signs reviewed  Last Vitals: BP 117/71 mmHg  Pulse 82  Temp(Src) 36.8 C (Oral)  Resp 18  Ht 5' (1.524 m)  Wt 221 lb 3.2 oz (100.336 kg)  BMI 43.20 kg/m2  SpO2 97%  LMP 09/11/2013  Breastfeeding? Unknown  Post vital signs: Reviewed  Level of consciousness: sedated  Complications: No apparent anesthesia complications

## 2014-05-30 DIAGNOSIS — O9902 Anemia complicating childbirth: Secondary | ICD-10-CM | POA: Diagnosis not present

## 2014-05-30 LAB — CBC
HEMATOCRIT: 26.2 % — AB (ref 36.0–46.0)
HEMOGLOBIN: 8.6 g/dL — AB (ref 12.0–15.0)
MCH: 29.4 pg (ref 26.0–34.0)
MCHC: 32.8 g/dL (ref 30.0–36.0)
MCV: 89.4 fL (ref 78.0–100.0)
Platelets: 181 10*3/uL (ref 150–400)
RBC: 2.93 MIL/uL — AB (ref 3.87–5.11)
RDW: 14.3 % (ref 11.5–15.5)
WBC: 10.1 10*3/uL (ref 4.0–10.5)

## 2014-05-30 NOTE — Progress Notes (Signed)
Patient ID: Samantha Lee, female   DOB: [birth year], 28 y.o.   MRN: [id number] Subjective: POD# 1 s/p Cesarean Delivery.  Indications: repeat/early labor  RH status/Rubella reviewed. Feeding: breast Patient reports tolerating PO.  Denies HA/SOB/C/P/N/V/dizziness. Breast symptoms: No..  She reports vaginal bleeding as normal, without clots.  She is ambulating, urinating without difficulty.     Objective: Vital signs in last 24 hours: BP 119/67 mmHg  Pulse 92  Temp(Src) 98.1 F (36.7 C) (Oral)  Resp 18  Ht 5' (1.524 m)  Wt 100.336 kg (221 lb 3.2 oz)  BMI 43.20 kg/m2  SpO2 100%  LMP 09/11/2013  Breastfeeding? Unknown       Physical Exam:  General: alert CV: Regular rate and rhythm Resp: clear Abdomen: soft, nontender, normal bowel sounds Lochia: moderate Uterine Fundus: firm, below umbilicus, nontender Incision: clean, dry and intact Ext: extremities normal, atraumatic, no cyanosis or edema and Homans sign is negative, no sign of DVT    Recent Labs  05/29/14 0420 05/30/14 0555  HGB 11.1* 8.6*  HCT 33.1* 26.2*      Assessment/Plan: 28 y.o.  status post Cesarean section. POD# 1.   Doing well, anemia stable.              Advance diet as tolerated Start po pain meds D/C foley  HLIV  Ambulate IS Routine post-op care  JACKSON-MOORE,Samantha Lee A 05/30/2014, 11:48 AM

## 2014-05-30 NOTE — Anesthesia Postprocedure Evaluation (Signed)
  Anesthesia Post-op Note  Anesthesia Post Note  Patient: Samantha Lee  Procedure(s) Performed: Procedure(s) (LRB): CESAREAN SECTION (N/A)  Anesthesia type: Spinal  Patient location: Mother/Baby  Post pain: Pain level controlled  Post assessment: Post-op Vital signs reviewed  Last Vitals:  Filed Vitals:   05/30/14 0853  BP: 119/67  Pulse: 92  Temp: 36.7 C  Resp: 18    Post vital signs: Reviewed  Level of consciousness: awake  Complications: No apparent anesthesia complications

## 2014-05-30 NOTE — Addendum Note (Signed)
Addendum  created 05/30/14 [id number] by Billie Lade, CRNA   Modules edited: Notes Section   Notes Section:  File: [id number]

## 2014-05-30 NOTE — Lactation Note (Signed)
This note was copied from the chart of Samantha Leianna Barga. Lactation Consultation Note     Follow up consult with this mom of a NICU baby, 37 2/7 weeks CGA and 48 hours old. Mom has been pumping and expressing about 5-7 mls of white colsoosum. I reviewed the nICU booklet and Baby and Me booklet with mom. Mom has a DEP at [employer]. Mom is going to begin breast feeding her baby in the nICU. Mom aware of lactation serivces also.  Patient Name: Samantha Lee HYIFO'Y Date: 05/30/2014 Reason for consult: Follow-up assessment   Maternal Data    Feeding    LATCH Score/Interventions                      Lactation Tools Discussed/Used Pump Review: Setup, frequency, and cleaning;Milk Storage;Other (comment) (premie settign hand expression) Initiated by:: Crisoforo Oxford. Blanchard Date initiated:: 05/29/14   Consult Status      [name] 05/30/2014, 3:37 PM

## 2014-05-31 ENCOUNTER — Encounter: Payer: Self-pay | Admitting: *Deleted

## 2014-05-31 ENCOUNTER — Encounter (HOSPITAL_COMMUNITY): Payer: Self-pay | Admitting: Obstetrics & Gynecology

## 2014-05-31 LAB — RPR

## 2014-05-31 NOTE — Progress Notes (Signed)
Subjective: Postpartum Day 2: Cesarean Delivery Patient reports tolerating PO, + flatus and no problems voiding.    Objective: Vital signs in last 24 hours: Temp:  [98.1 F (36.7 C)-99.1 F (37.3 C)] 98.4 F (36.9 C) (12/28 0211) Pulse Rate:  [78-92] 78 (12/28 0608) Resp:  [18-20] 20 (12/28 1552) BP: (113-120)/(62-71) 120/71 mmHg (12/28 0608) SpO2:  [100 %] 100 % (12/27 0853)  Physical Exam:  General: alert and no distress Lochia: appropriate Uterine Fundus: firm Incision: healing well DVT Evaluation: No evidence of DVT seen on physical exam.   Recent Labs  05/29/14 0420 05/30/14 0555  HGB 11.1* 8.6*  HCT 33.1* 26.2*    Assessment/Plan: Status post Cesarean section. Doing well postoperatively.  Continue current care.  HARPER,CHARLES A 05/31/2014, 8:42 AM

## 2014-05-31 NOTE — Lactation Note (Signed)
This note was copied from the chart of Samantha Lee. Lactation Consultation Note  Patient Name: Samantha Lee OLMBE'M Date: 05/31/2014 Reason for consult: Follow-up assessment;NICU baby Mom reports some intermittent discomfort with pumping. Checked flange and changed to size 27, Mom denies discomfort with larger flange. Mom last pumped at 1515 and received approx 2 ml of colostrum. Mom reports she is pumping but occasionally she goes longer than 3 hours between pumping if asleep. LC stressed importance of consistent pumping to encourage milk production. Advised to set alarm with pumping schedule, pump for 15-20 minutes each pumping. Care for sore nipples reviewed, advised to apply EBM, comfort gels given with instructions. Mom has DEBP for home use. Advised to call if discomfort does not improve or for questions/concerns.   Maternal Data    Feeding Feeding Type: Formula Nipple Type: Regular Length of feed: 30 min  LATCH Score/Interventions          Comfort (Breast/Nipple): Filling, red/small blisters or bruises, mild/mod discomfort  Problem noted: Mild/Moderate discomfort Interventions (Mild/moderate discomfort): Comfort gels;Hand expression (EBM to sore nipples)        Lactation Tools Discussed/Used Tools: Comfort gels;Pump Breast pump type: Double-Electric Breast Pump   Consult Status Consult Status: Follow-up Date: 06/01/14 Follow-up type: In-patient    Katrine Coho 05/31/2014, 4:46 PM

## 2014-06-01 ENCOUNTER — Encounter: Payer: Self-pay | Admitting: Obstetrics & Gynecology

## 2014-06-01 MED ORDER — IBUPROFEN 600 MG PO TABS: 600.0000 mg | ORAL_TABLET | Freq: Four times a day (QID) | ORAL | Status: DC | PRN

## 2014-06-01 MED ORDER — FERROUS SULFATE 325 (65 FE) MG PO TABS: 325.0000 mg | ORAL_TABLET | Freq: Two times a day (BID) | ORAL | Status: DC

## 2014-06-01 MED ORDER — OXYCODONE-ACETAMINOPHEN 5-325 MG PO TABS: 1.0000 | ORAL_TABLET | ORAL | Status: DC | PRN

## 2014-06-01 NOTE — Progress Notes (Signed)
Clinical Social Work Department BRIEF PSYCHOSOCIAL ASSESSMENT 06/01/2014  Patient:  Samantha Lee, Samantha Lee     Account Number:  [id number]     Admit date:  05/29/2014  Clinical Social Worker:  Barbarann Ehlers  Date/Time:  06/01/2014 11:00 AM  Referred by:    Date Referred:    Other Referral:   No referral-NICU admission   Interview type:  Family Other interview type:    PSYCHOSOCIAL DATA Living Status:  FAMILY Admitted from facility:   Level of care:   Primary support name:  Kiwanna Spraker Primary support relationship to patient:  SPOUSE Degree of support available:   Good supports    CURRENT CONCERNS Current Concerns  None Noted   Other Concerns:    SOCIAL WORK ASSESSMENT / PLAN CSW met with parents in MOB's first floor room to introduce myself, offer support and complete assessment due to baby's admission to NICU for hypoglycemia.  CSW discussed common emotions related to the NICU experience, encouraged parents to embrace the situation as their third daughter's unique birth story, but to allow themselves to be emotional since this is not what they planned or expected.  CSW explained ongoing support services offered by NICU CSW and discussed signs and symptoms of PPD.  CSW asked that MOB contact her doctor and or CSW if she has emotional concerns at any time.  MOB was very sleepy throughout assessment.  CSW has no social concerns at this time.   Assessment/plan status:  Psychosocial Support/Ongoing Assessment of Needs Other assessment/ plan:   Information/referral to community resources:   No referral needs noted at this time.    PATIENT'S/FAMILY'S RESPONSE TO PLAN OF CARE: Parents welcomed CSW's visit.  They were pleasant and state they are doing well.  MOB admits that she is having a difficult time coping with baby's admission to NICU at this time.  Today is her day of discharge and she is feeling sad about leaving without her baby.  CSW validated these feelings.  MOB seemed  to appreciate the conversation and validation.  She states she has good supports and no hx of PPD.  She is hopeful that baby will not have to be here long, but finds it especially hard that it is not possible to know a discharge date.  She is understanding of why a discharge date cannot be given at this time.  She states no issues with transportation or childcare in order to return to the hospital to see her baby.  She states no questions or needs at this time.  MOB agrees to talk with CSW and or doctor if emotional concerns arise.  She and FOB thanked CSW for visiting.

## 2014-06-01 NOTE — Progress Notes (Addendum)
Subjective: Postpartum Day 3: Cesarean Delivery Patient reports tolerating PO, + flatus, + BM and no problems voiding.    Objective: Vital signs in last 24 hours: Temp:  [98.2 F (36.8 C)-98.4 F (36.9 C)] 98.2 F (36.8 C) (12/29 0600) Pulse Rate:  [73-77] 77 (12/29 0600) Resp:  [19-20] 20 (12/29 0600) BP: (124-125)/(68-78) 125/78 mmHg (12/29 0600)  Physical Exam:  General: alert and no distress Lochia: appropriate Uterine Fundus: firm Incision: healing well DVT Evaluation: No evidence of DVT seen on physical exam.   Recent Labs  05/30/14 0555  HGB 8.6*  HCT 26.2*    Assessment/Plan: Status post Cesarean section. Doing well postoperatively.  Anemia.  Clinically stable.  Iron Rx. Discharge home with standard precautions and return to clinic in 2 weeks.  HARPER,CHARLES A 06/01/2014, 8:04 AM

## 2014-06-01 NOTE — Discharge Summary (Addendum)
Obstetric Discharge Summary Reason for Admission: onset of labor Prenatal Procedures: ultrasound Intrapartum Procedures: cesarean: low cervical, transverse Postpartum Procedures: none Complications-Operative and Postpartum: none HEMOGLOBIN  Date Value Ref Range Status  05/30/2014 8.6* 12.0 - 15.0 g/dL Final    Comment:    REPEATED TO VERIFY DELTA CHECK NOTED    HCT  Date Value Ref Range Status  05/30/2014 26.2* 36.0 - 46.0 % Final    Physical Exam:  General: alert and no distress Lochia: appropriate Uterine Fundus: firm Incision: healing well DVT Evaluation: No evidence of DVT seen on physical exam.  Discharge Diagnoses: Term Pregnancy-delivered                                          Anemia.  Clinically stable.  Discharge Information: Date: 06/01/2014 Activity: pelvic rest Diet: routine Medications: PNV, Ibuprofen, Colace, Iron and Percocet Condition: stable Instructions: refer to practice specific booklet Discharge to: home Follow-up Information    Follow up with HARPER,CHARLES A, MD.   Specialty:  Obstetrics and Gynecology   Contact information:   [address] [place] [address] [phone]       Newborn Data: Live born female  Birth Weight: 8 lb 9.2 oz (3890 g) APGAR: 8, 8  Baby in NICU.  HARPER,CHARLES A 06/01/2014, 8:15 AM

## 2014-06-01 NOTE — Lactation Note (Signed)
This note was copied from the chart of Samantha Katlyn Muldrew. Lactation Consultation Note  Mom has increased the frequency of pumping. She expressed about 20 ml this morning.  She was a little sad that she is not able to hold the baby related to baby's I.V. Encouragement given. She does have a breast pump at home. Aware of outpatient support.  Patient Name: Samantha Lee IAXKP'V Date: 06/01/2014     Maternal Data    Feeding Feeding Type: Formula Length of feed: 30 min  LATCH Score/Interventions                      Lactation Tools Discussed/Used     Consult Status      Van Clines 06/01/2014, 8:54 AM

## 2014-06-03 ENCOUNTER — Encounter: Payer: 59 | Admitting: Obstetrics & Gynecology

## 2014-06-10 ENCOUNTER — Encounter: Payer: 59 | Admitting: Obstetrics

## 2014-06-16 ENCOUNTER — Encounter: Payer: Self-pay | Admitting: *Deleted

## 2014-06-16 ENCOUNTER — Encounter (HOSPITAL_COMMUNITY): Admission: RE | Payer: Self-pay | Source: Ambulatory Visit

## 2014-06-16 ENCOUNTER — Inpatient Hospital Stay (HOSPITAL_COMMUNITY): Admission: RE | Admit: 2014-06-16 | Payer: 59 | Source: Ambulatory Visit | Admitting: Obstetrics

## 2014-06-16 SURGERY — Surgical Case
Anesthesia: Regional

## 2014-06-28 ENCOUNTER — Telehealth: Payer: Self-pay | Admitting: *Deleted

## 2014-06-28 ENCOUNTER — Ambulatory Visit: Payer: Self-pay | Admitting: Obstetrics

## 2014-06-28 ENCOUNTER — Ambulatory Visit (INDEPENDENT_AMBULATORY_CARE_PROVIDER_SITE_OTHER): Payer: 59 | Admitting: Obstetrics

## 2014-06-28 DIAGNOSIS — R52 Pain, unspecified: Secondary | ICD-10-CM

## 2014-06-28 DIAGNOSIS — F418 Other specified anxiety disorders: Secondary | ICD-10-CM

## 2014-06-28 DIAGNOSIS — F329 Major depressive disorder, single episode, unspecified: Secondary | ICD-10-CM

## 2014-06-28 DIAGNOSIS — F419 Anxiety disorder, unspecified: Secondary | ICD-10-CM

## 2014-06-28 DIAGNOSIS — O9903 Anemia complicating the puerperium: Secondary | ICD-10-CM

## 2014-06-28 DIAGNOSIS — Z30011 Encounter for initial prescription of contraceptive pills: Secondary | ICD-10-CM

## 2014-06-28 DIAGNOSIS — K5909 Other constipation: Secondary | ICD-10-CM

## 2014-06-28 DIAGNOSIS — K5904 Chronic idiopathic constipation: Secondary | ICD-10-CM

## 2014-06-28 MED ORDER — SERTRALINE HCL 50 MG PO TABS: 50.0000 mg | ORAL_TABLET | Freq: Every day | ORAL | Status: DC

## 2014-06-28 MED ORDER — TRAMADOL HCL 50 MG PO TABS: 50.0000 mg | ORAL_TABLET | Freq: Four times a day (QID) | ORAL | Status: DC | PRN

## 2014-06-28 MED ORDER — DOCUSATE SODIUM 100 MG PO CAPS: 100.0000 mg | ORAL_CAPSULE | Freq: Two times a day (BID) | ORAL | Status: DC

## 2014-06-28 NOTE — Progress Notes (Signed)
Subjective:     Samantha Lee is a 29 y.o. female who presents for a postpartum visit. She is 4 weeks postpartum following a low cervical transverse Cesarean section. I have fully reviewed the prenatal and intrapartum course. The delivery was at 61 gestational weeks. Outcome: repeat cesarean section, low transverse incision. Anesthesia: spinal. Postpartum course has been normal. Baby's course has been normal. Baby is feeding by breast. Bleeding no bleeding. Bowel function is normal. Bladder function is normal. Patient is not sexually active. Contraception method is oral progesterone-only contraceptive. Postpartum depression screening: positive.  Tobacco, alcohol and substance abuse history reviewed.  Adult immunizations reviewed including TDAP, rubella and varicella.  The following portions of the patient's history were reviewed and updated as appropriate: allergies, current medications, past family history, past medical history, past social history, past surgical history and problem list.  Review of Systems A comprehensive review of systems was negative except for: Behavioral/Psych: positive for anxiety   Objective:    BP 125/83 mmHg  Pulse 70  Wt 202 lb (91.627 kg)  Breastfeeding? Yes   PE:  Deferred  Assessment:    Postpartum.  4 weeks.  Doing well.  Anxiety / Depression.  Plan:    1. Contraception: oral progesterone-only contraceptive 2. Zoloft Rx 3. Follow up in: 4 weeks or as needed.   Healthy lifestyle practices reviewed

## 2014-06-28 NOTE — Telephone Encounter (Signed)
Fax received for refill of Iron for patient. Rx refilled per Dr Jodi Mourning. Iron 325 mg bid 5 RF 90 day supply.

## 2014-06-29 ENCOUNTER — Encounter: Payer: Self-pay | Admitting: Obstetrics

## 2014-07-08 ENCOUNTER — Encounter: Payer: Self-pay | Admitting: *Deleted

## 2014-07-09 ENCOUNTER — Telehealth: Payer: Self-pay

## 2014-07-09 NOTE — Telephone Encounter (Signed)
left message that patient's letter and medical records were faxed thursday 07/08/14, by Opal Sidles - confimation went through

## 2014-07-26 ENCOUNTER — Ambulatory Visit (INDEPENDENT_AMBULATORY_CARE_PROVIDER_SITE_OTHER): Payer: 59 | Admitting: Obstetrics

## 2014-07-26 ENCOUNTER — Ambulatory Visit: Payer: 59 | Admitting: Obstetrics

## 2014-07-26 ENCOUNTER — Encounter: Payer: Self-pay | Admitting: Obstetrics

## 2014-07-26 DIAGNOSIS — F53 Postpartum depression: Secondary | ICD-10-CM

## 2014-07-26 DIAGNOSIS — O99345 Other mental disorders complicating the puerperium: Secondary | ICD-10-CM

## 2014-07-26 DIAGNOSIS — F329 Major depressive disorder, single episode, unspecified: Secondary | ICD-10-CM

## 2014-07-27 ENCOUNTER — Encounter: Payer: Self-pay | Admitting: Obstetrics

## 2014-07-27 NOTE — Progress Notes (Signed)
Subjective:     Samantha Lee is a 29 y.o. female who presents for a postpartum visit. She is 7 weeks postpartum following a low cervical transverse Cesarean section. I have fully reviewed the prenatal and intrapartum course. The delivery was at 46 gestational weeks. Outcome: repeat cesarean section, low transverse incision. Anesthesia: spinal. Postpartum course has been normal. Baby's course has been normal. Baby is feeding by breast. Bleeding no bleeding. Bowel function is normal. Bladder function is normal. Patient is not sexually active. Contraception method is abstinence. Postpartum depression screening: positive.  Tobacco, alcohol and substance abuse history reviewed.  Adult immunizations reviewed including TDAP, rubella and varicella.  The following portions of the patient's history were reviewed and updated as appropriate: allergies, current medications, past family history, past medical history, past social history, past surgical history and problem list.  Review of Systems:  Positive for Depression   Objective:    BP 128/84 mmHg  Pulse 89  Temp(Src) 97.3 F (36.3 C)  Ht 5' (1.524 m)  Wt 208 lb (94.348 kg)  BMI 40.62 kg/m2  Breastfeeding? Yes  General:  alert and no distress   Breasts:  inspection negative, no nipple discharge or bleeding, no masses or nodularity palpable  Lungs: clear to auscultation bilaterally  Heart:  regular rate and rhythm, S1, S2 normal, no murmur, click, rub or gallop  Abdomen: normal findings: soft, non-tender   Vulva:  normal  Vagina: normal vagina  Cervix:  no cervical motion tenderness  Corpus: normal size, contour, position, consistency, mobility, non-tender  Adnexa:  no mass, fullness, tenderness  Rectal Exam: Not performed.           Assessment:     Normal postpartum exam. Pap smear not done at today's visit.   Depression.  Stable.  Plan:    1. Contraception: Considering options 2. Continue PNV's 3. Follow up in: 4 weeks or as  needed.  2hr GTT for h/o GDM/screening for DM q 3 yrs per ADA recommendations  4.  Continue Zoloft, and Counseling needed  Healthy lifestyle practices reviewed

## 2014-08-12 ENCOUNTER — Telehealth: Payer: Self-pay | Admitting: *Deleted

## 2014-08-12 DIAGNOSIS — Z3041 Encounter for surveillance of contraceptive pills: Secondary | ICD-10-CM

## 2014-08-12 MED ORDER — NORETHINDRONE 0.35 MG PO TABS: 1.0000 | ORAL_TABLET | Freq: Every day | ORAL | Status: DC

## 2014-08-12 NOTE — Telephone Encounter (Signed)
Patient states that her breast milk production has decreased since starting the birth control pills and she wants to switch to POP. Advised patient would call in new Rx and also to increase fluids and use Fenugreek as directed.

## 2014-10-20 IMAGING — US US OB TRANSVAGINAL
1 series · 14 of 28 positions shown · non-contrast
Comparison: None.

CLINICAL DATA: First trimester pregnancy with vaginal bleeding.

EXAM:
OBSTETRIC <14 WK US AND TRANSVAGINAL OB US
TECHNIQUE: Both transabdominal and transvaginal ultrasound examinations were
performed for complete evaluation of the gestation as well as the
maternal uterus, adnexal regions, and pelvic cul-de-sac.
Transvaginal technique was performed to assess early pregnancy.

[Series 1: us ob comp less 14 wks · 14 of 34 slices shown]
[im 2/34]
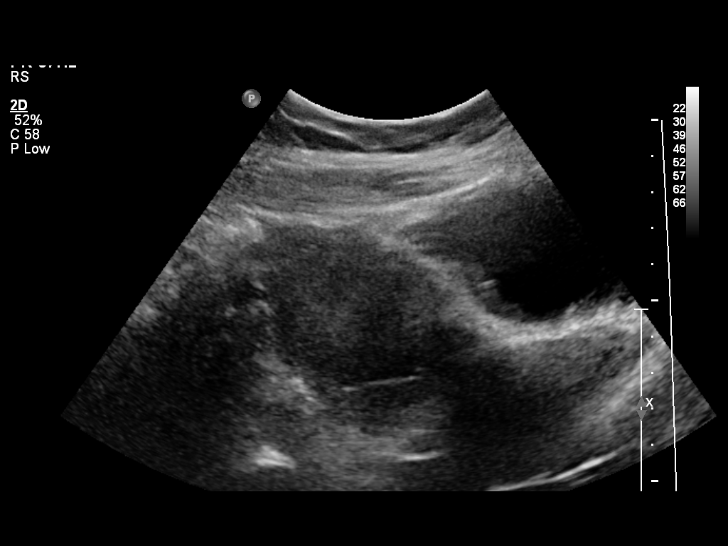
[im 4/34]
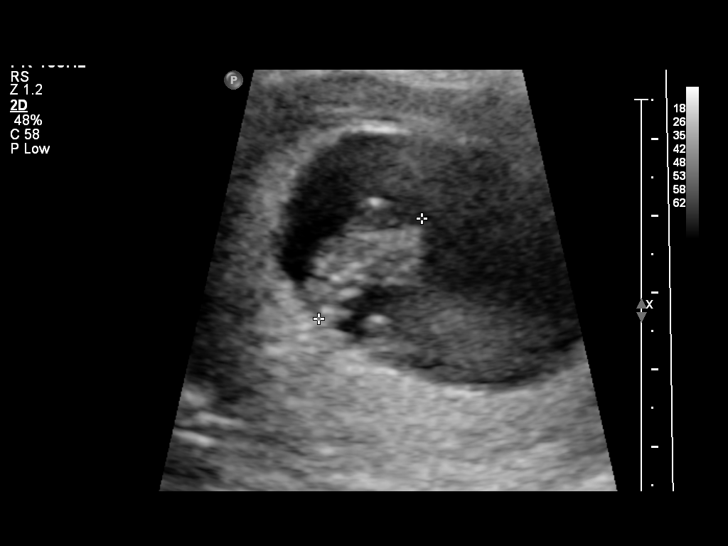
[im 7/34]
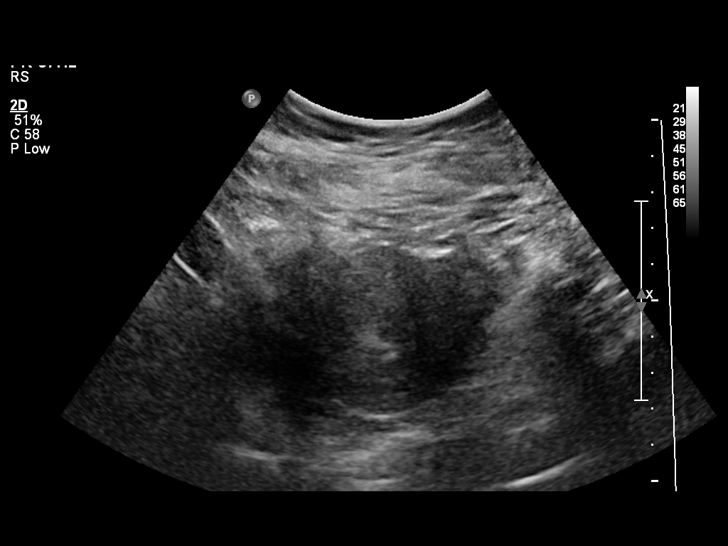
[im 9/34]
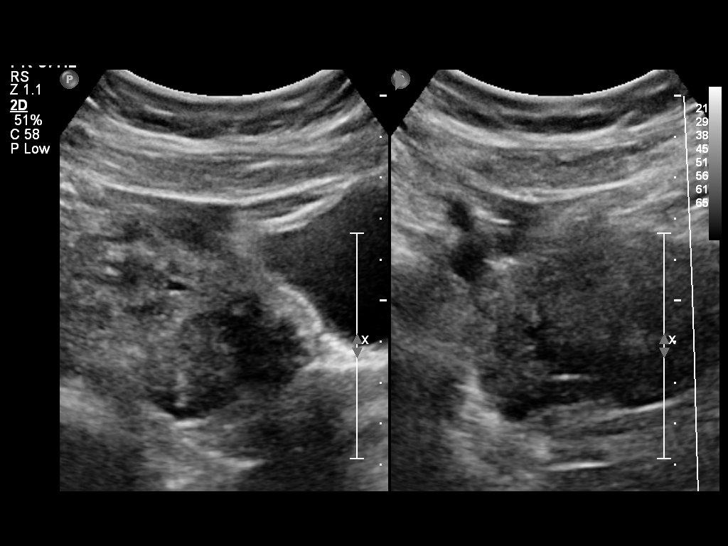
[im 12/34]
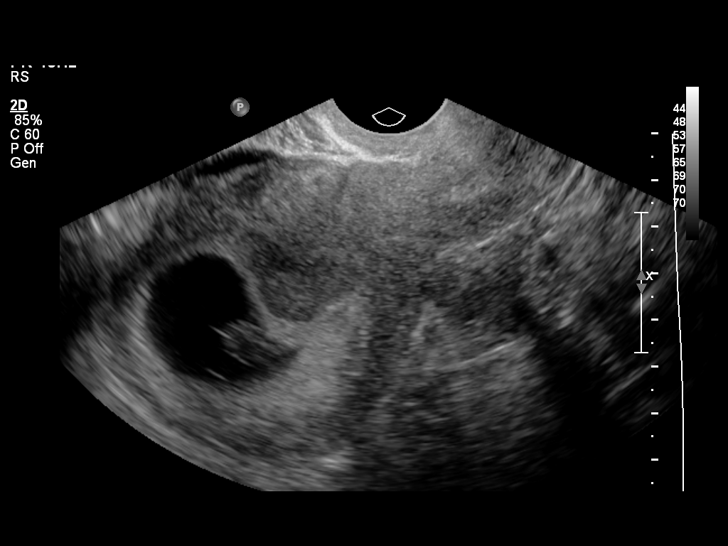
[im 14/34]
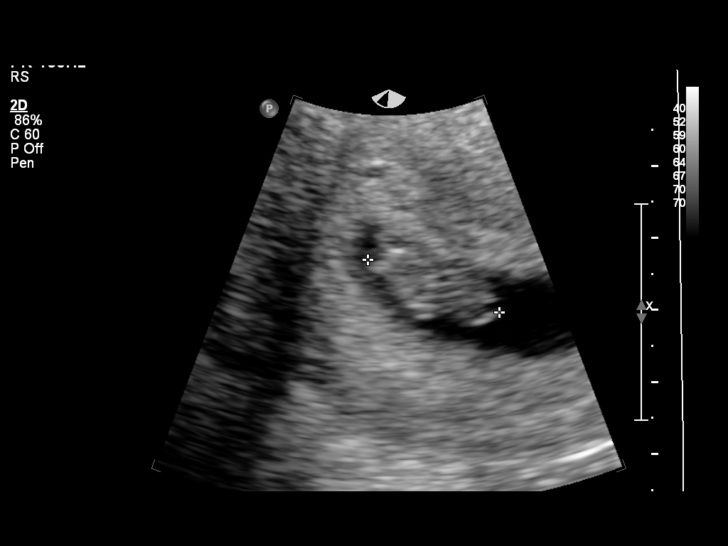
[im 16/34]
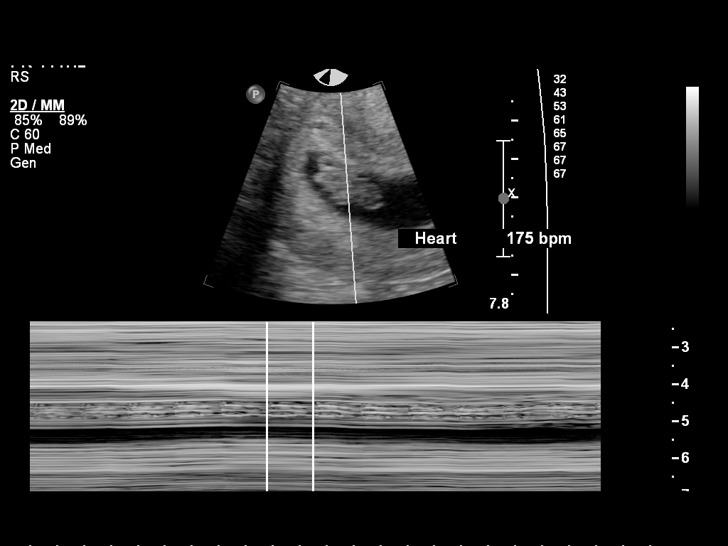
[im 19/34]
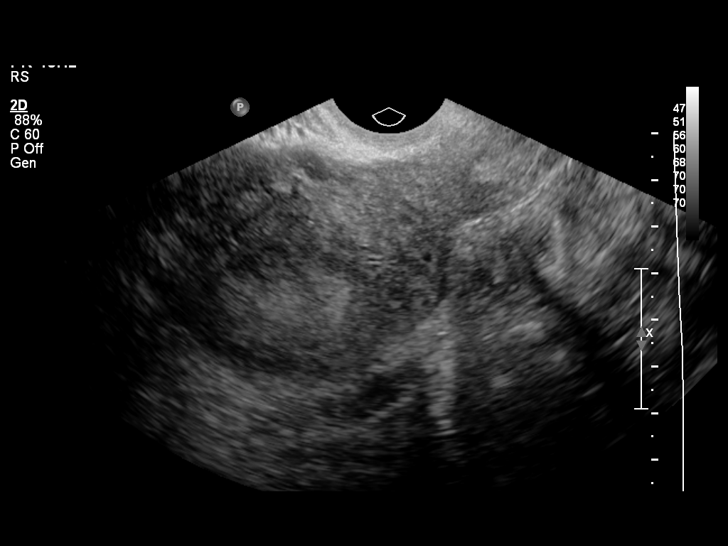
[im 21/34]
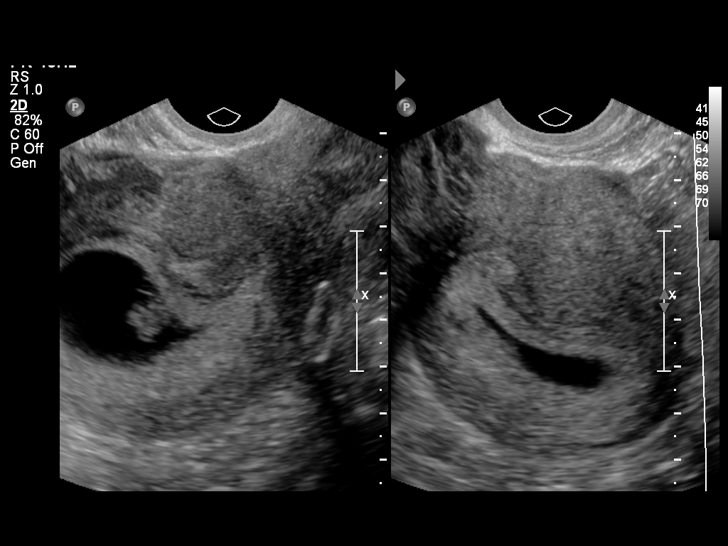
[im 24/34]
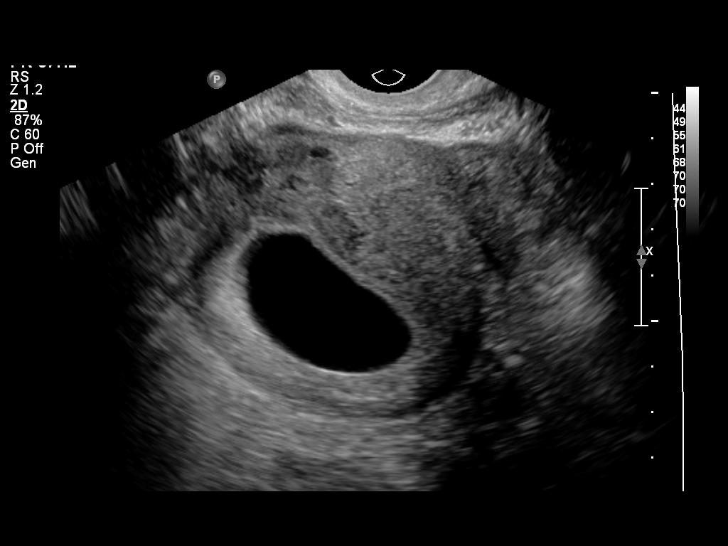
[im 26/34]
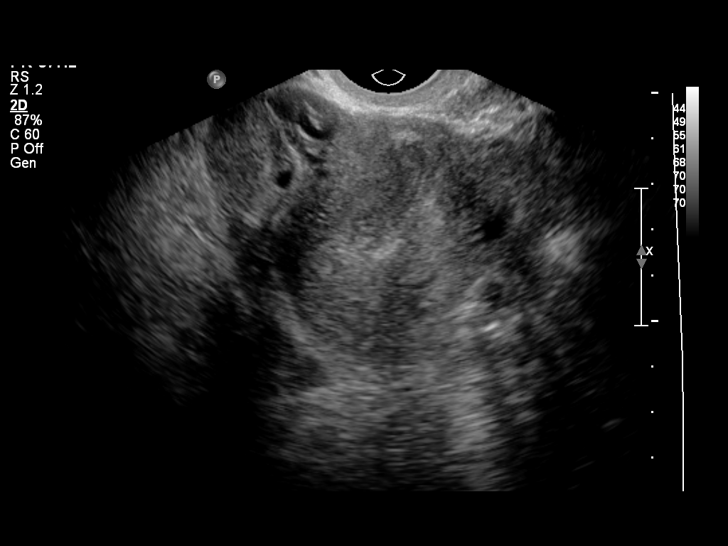
[im 29/34]
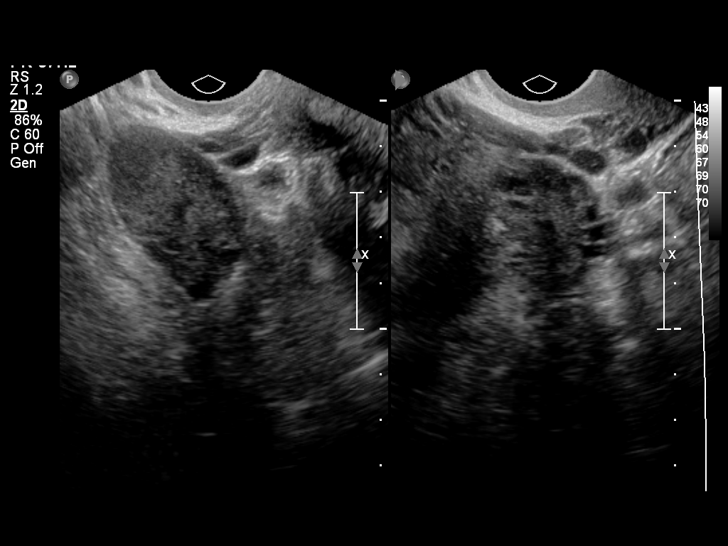
[im 31/34]
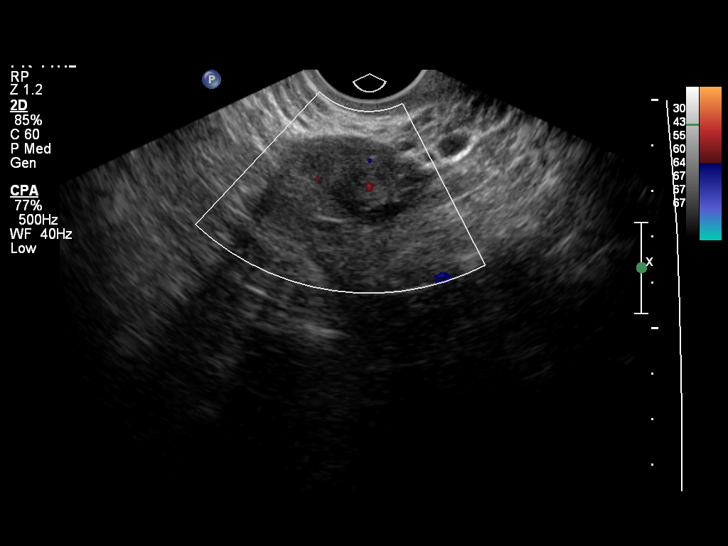
[im 34/34]
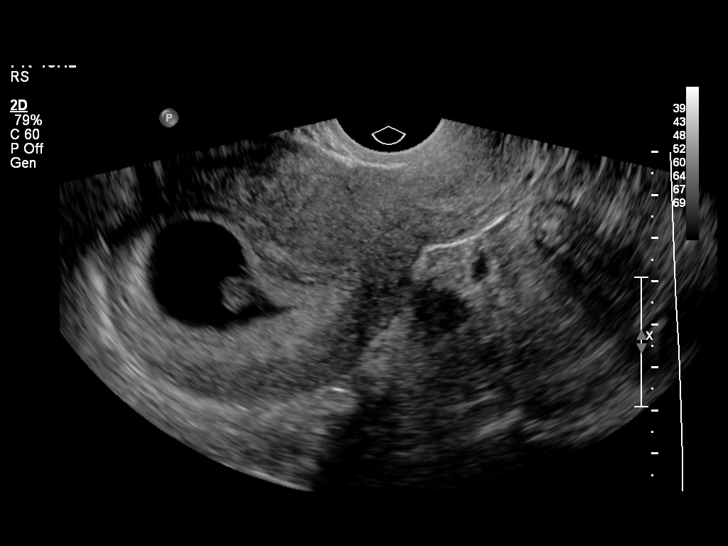

[14 of 28 positions shown; findings below may reference images not displayed]

FINDINGS: Intrauterine gestational sac: Visualized/normal in shape.

Yolk sac:  Visualized.

Embryo:  Visualized.

Cardiac Activity: Visualized.

Heart Rate:  175 bpm

CRL:   19.2  mm   8 w 4 d                  US EDC: 06/22/2014

Maternal uterus/adnexae: There is a small subchorionic hematoma,
measuring up to 2.7 cm in diameter. Both maternal ovaries appear
normal. There is no adnexal mass or free pelvic fluid.
IMPRESSION: 1. Single live intrauterine gestation with best estimated
gestational age of 8 weeks 4 days.
2. Small subchorionic hematoma.
3. No adnexal abnormalities identified.

## 2015-02-24 ENCOUNTER — Ambulatory Visit: Payer: 59 | Admitting: Obstetrics

## 2015-04-11 ENCOUNTER — Encounter: Payer: Self-pay | Admitting: Obstetrics

## 2015-04-11 ENCOUNTER — Ambulatory Visit (INDEPENDENT_AMBULATORY_CARE_PROVIDER_SITE_OTHER): Payer: 59 | Admitting: Obstetrics

## 2015-04-11 VITALS — BP 123/87 | HR 79 | Temp 98.2°F | Ht 61.0 in | Wt 210.0 lb

## 2015-04-11 DIAGNOSIS — N809 Endometriosis, unspecified: Secondary | ICD-10-CM

## 2015-04-11 DIAGNOSIS — Z30011 Encounter for initial prescription of contraceptive pills: Secondary | ICD-10-CM

## 2015-04-11 DIAGNOSIS — Z01419 Encounter for gynecological examination (general) (routine) without abnormal findings: Secondary | ICD-10-CM

## 2015-04-11 DIAGNOSIS — Z3202 Encounter for pregnancy test, result negative: Secondary | ICD-10-CM | POA: Diagnosis not present

## 2015-04-11 DIAGNOSIS — N946 Dysmenorrhea, unspecified: Secondary | ICD-10-CM

## 2015-04-11 LAB — POCT URINE PREGNANCY: Preg Test, Ur: NEGATIVE

## 2015-04-11 MED ORDER — LEVONORGESTREL-ETHINYL ESTRAD 0.15-30 MG-MCG PO TABS: 1.0000 | ORAL_TABLET | Freq: Every day | ORAL | Status: DC

## 2015-04-11 MED ORDER — IBUPROFEN 800 MG PO TABS: 800.0000 mg | ORAL_TABLET | Freq: Three times a day (TID) | ORAL | Status: DC | PRN

## 2015-04-11 MED ORDER — OXYCODONE-ACETAMINOPHEN 5-325 MG PO TABS: 1.0000 | ORAL_TABLET | ORAL | Status: DC | PRN

## 2015-04-11 NOTE — Progress Notes (Signed)
Subjective:        Samantha Lee is a 29 y.o. female here for a routine exam.  Current complaints: H/O endometriosis.    Personal health questionnaire:  Is patient Ashkenazi Jewish, have a family history of breast and/or ovarian cancer: no Is there a family history of uterine cancer diagnosed at age < 47, gastrointestinal cancer, urinary tract cancer, family member who is a Field seismologist syndrome-associated carrier: no Is the patient overweight and hypertensive, family history of diabetes, personal history of gestational diabetes, preeclampsia or PCOS: no Is patient over 23, have PCOS,  family history of premature CHD under age 77, diabetes, smoke, have hypertension or peripheral artery disease:  no At any time, has a partner hit, kicked or otherwise hurt or frightened you?: no Over the past 2 weeks, have you felt down, depressed or hopeless?: no Over the past 2 weeks, have you felt little interest or pleasure in doing things?:no   Gynecologic History No LMP recorded. Patient is not currently having periods (Reason: Other). Contraception: none Last Pap: 2014. Results were: normal Last mammogram: n/a. Results were: n/a  Obstetric History OB History  Gravida Para Term Preterm AB SAB TAB Ectopic Multiple Living  4 4 3 1  0 0   0 3    # Outcome Date GA Lbr Len/2nd Weight Sex Delivery Anes PTL Lv  4 Term 05/29/14 [redacted]w[redacted]d  8 lb 9.2 oz (3.89 kg) F CS-LTranv Spinal  Y  3 Term 08/18/07 [redacted]w[redacted]d  6 lb 4 oz (2.835 kg) F CS-LTranv Spinal N Y  2 Term 07/21/03 [redacted]w[redacted]d  6 lb (2.722 kg) F CS-LTranv Spinal Y Y  1 Preterm 2003        ND      Past Medical History  Diagnosis Date  . Headache(784.0)     otc med prn  . Endometriosis   . Preterm labor   . Gestational diabetes   . PONV (postoperative nausea and vomiting)     Past Surgical History  Procedure Laterality Date  . Cesarean section      x 2  . Dilation and curettage of uterus      mab  . Laparoscopy N/A 01/15/2013    Procedure:  LAPAROSCOPY DIAGNOSTIC  ;  Surgeon: Lahoma Crocker, MD;  Location: [facility];  Service: Gynecology;  Laterality: N/A;  . Laparoscopic lysis of adhesions N/A 01/15/2013    Procedure: LAPAROSCOPIC LYSIS OF ADHESIONS;  Surgeon: Lahoma Crocker, MD;  Location: [facility];  Service: Gynecology;  Laterality: N/A;  with ablation of endometriotic implants   . Laparoscopy Right 01/15/2013    Procedure: LAPAROSCOPY OPERATIVE  ;  Surgeon: Lahoma Crocker, MD;  Location: [facility];  Service: Gynecology;  Laterality: Right;  R sidewall biopsy  . Cesarean section N/A 05/29/2014    Procedure: CESAREAN SECTION;  Surgeon: Lahoma Crocker, MD;  Location: [facility];  Service: Obstetrics;  Laterality: N/A;     Current outpatient prescriptions:  .  oxyCODONE-acetaminophen (PERCOCET/ROXICET) 5-325 MG tablet, Take 1-2 tablets by mouth every 4 (four) hours as needed for moderate pain or severe pain (for pain scale equal to or greater than 7)., Disp: 40 tablet, Rfl: 0 .  ibuprofen (ADVIL,MOTRIN) 800 MG tablet, Take 1 tablet (800 mg total) by mouth every 8 (eight) hours as needed., Disp: 30 tablet, Rfl: 5 .  levonorgestrel-ethinyl estradiol (NORDETTE) 0.15-30 MG-MCG tablet, Take 1 tablet by mouth daily., Disp: 1 Package, Rfl: 11 No Known Allergies  Social History  Substance Use Topics  .  Smoking status: Never Smoker   . Smokeless tobacco: Never Used  . Alcohol Use: No    Family History  Problem Relation Age of Onset  . Hypertension Mother       Review of Systems  Constitutional: negative for fatigue and weight loss Respiratory: negative for cough and wheezing Cardiovascular: negative for chest pain, fatigue and palpitations Gastrointestinal: negative for abdominal pain and change in bowel habits Musculoskeletal:negative for myalgias Neurological: negative for gait problems and tremors Behavioral/Psych: negative for abusive relationship, depression Endocrine: negative for temperature intolerance    Genitourinary:negative for abnormal menstrual periods, genital lesions, hot flashes, sexual problems and vaginal discharge Integument/breast: negative for breast lump, breast tenderness, nipple discharge and skin lesion(s)    Objective:       BP 123/87 mmHg  Pulse 79  Temp(Src) 98.2 F (36.8 C)  Ht 5\' 1"  (1.549 m)  Wt 210 lb (95.255 kg)  BMI 39.70 kg/m2  Breastfeeding? No General:   alert  Skin:   no rash or abnormalities  Lungs:   clear to auscultation bilaterally  Heart:   regular rate and rhythm, S1, S2 normal, no murmur, click, rub or gallop  Breasts:   normal without suspicious masses, skin or nipple changes or axillary nodes  Abdomen:  normal findings: no organomegaly, soft, non-tender and no hernia  Pelvis:  External genitalia: normal general appearance Urinary system: urethral meatus normal and bladder without fullness, nontender Vaginal: normal without tenderness, induration or masses Cervix: normal appearance Adnexa: normal bimanual exam Uterus: anteverted and non-tender, normal size   Lab Review Urine pregnancy test Labs reviewed yes Radiologic studies reviewed no    Assessment:    Healthy female exam.    H/O Endometriosis  Dysmenorrhea  Contraceptive counseling   Plan:   Ibuprofen Rx Nordette 28 Rx   Education reviewed: calcium supplements, low fat, low cholesterol diet, self breast exams and weight bearing exercise. Contraception: OCP (estrogen/progesterone). Follow up in: 1 year.   Meds ordered this encounter  Medications  . levonorgestrel-ethinyl estradiol (NORDETTE) 0.15-30 MG-MCG tablet    Sig: Take 1 tablet by mouth daily.    Dispense:  1 Package    Refill:  11  . oxyCODONE-acetaminophen (PERCOCET/ROXICET) 5-325 MG tablet    Sig: Take 1-2 tablets by mouth every 4 (four) hours as needed for moderate pain or severe pain (for pain scale equal to or greater than 7).    Dispense:  40 tablet    Refill:  0  . ibuprofen (ADVIL,MOTRIN) 800 MG  tablet    Sig: Take 1 tablet (800 mg total) by mouth every 8 (eight) hours as needed.    Dispense:  30 tablet    Refill:  5   Orders Placed This Encounter  Procedures  . SureSwab, Vaginosis/Vaginitis Plus  . POCT urine pregnancy

## 2015-04-12 ENCOUNTER — Encounter: Payer: Self-pay | Admitting: Obstetrics

## 2015-04-12 LAB — PAP IG W/ RFLX HPV ASCU

## 2015-04-14 LAB — SURESWAB, VAGINOSIS/VAGINITIS PLUS
Atopobium vaginae: NOT DETECTED Log (cells/mL)
C. ALBICANS, DNA: DETECTED — AB
C. PARAPSILOSIS, DNA: NOT DETECTED
C. TRACHOMATIS RNA, TMA: NOT DETECTED
C. glabrata, DNA: NOT DETECTED
C. tropicalis, DNA: NOT DETECTED
Gardnerella vaginalis: NOT DETECTED Log (cells/mL)
LACTOBACILLUS SPECIES: >8 Log (cells/mL)
MEGASPHAERA SPECIES: NOT DETECTED Log (cells/mL)
N. gonorrhoeae RNA, TMA: NOT DETECTED
T. VAGINALIS RNA, QL TMA: NOT DETECTED

## 2015-04-15 ENCOUNTER — Other Ambulatory Visit: Payer: Self-pay | Admitting: Obstetrics

## 2015-04-15 DIAGNOSIS — B373 Candidiasis of vulva and vagina: Secondary | ICD-10-CM

## 2015-04-15 DIAGNOSIS — B3731 Acute candidiasis of vulva and vagina: Secondary | ICD-10-CM

## 2015-04-15 MED ORDER — FLUCONAZOLE 150 MG PO TABS: 150.0000 mg | ORAL_TABLET | Freq: Once | ORAL | Status: DC

## 2015-06-24 ENCOUNTER — Telehealth: Payer: Self-pay

## 2015-06-24 NOTE — Telephone Encounter (Signed)
TRIED TO CALL PATIENT ABOUT FMLA PAPERWORK ON 06/24/15 - PHONE HAS BEEN DISCONNECTED - PUT PAPERWORK IN DRAWER - PT TO PAY 15.00

## 2015-07-29 ENCOUNTER — Other Ambulatory Visit: Payer: Self-pay | Admitting: Obstetrics

## 2015-09-21 ENCOUNTER — Other Ambulatory Visit: Payer: Self-pay | Admitting: Obstetrics

## 2015-10-24 ENCOUNTER — Telehealth: Payer: Self-pay | Admitting: *Deleted

## 2015-10-24 NOTE — Telephone Encounter (Signed)
Patient has a concern. Patient is on birth control- OCP- she is still cramping all the time. Patient skipped her cycle this month. Brown discharge when wiped today- just started new pack yesterday. No missed pills, no ATB- cramping is severe all week and into this week. Patient has been on Phentermine and lost a whole lot of weight. Advised UPT for piece of mind, see what happens this month - could be response to weight loss?, treat cramping with Ibuprofen.

## 2015-11-14 ENCOUNTER — Other Ambulatory Visit: Payer: Self-pay | Admitting: Obstetrics

## 2015-11-14 NOTE — Telephone Encounter (Signed)
Review for refill. 

## 2015-12-16 ENCOUNTER — Telehealth: Payer: Self-pay

## 2015-12-16 NOTE — Telephone Encounter (Signed)
FMLA PAPERWORK COMPLETED 7/14 - CALLED PATIENT AND LEFT VM - 15.00 - WENT ON AND FAXED AS WAS DUE 7/12

## 2016-01-13 ENCOUNTER — Telehealth: Payer: Self-pay | Admitting: *Deleted

## 2016-01-13 NOTE — Telephone Encounter (Signed)
Pt called to office with concern about BC and cycle.    Pt states she is not having cycle with use of her BC.  Pt states that she will have all the symptoms of her cycle but no bleeding.Pt states that she is having severe cramping and back pain. Pt states that this doesn't occur every month but the pain is persistent. Pt states she get little relief with use of Ibuprofen as prescribed.   Pt advise that message will be sent to Dr Jodi Mourning for review and recommendations.  Please advise.

## 2016-01-25 ENCOUNTER — Telehealth: Payer: Self-pay | Admitting: *Deleted

## 2016-01-25 NOTE — Telephone Encounter (Signed)
Talked to this patient this AM about BCP,amenorrhea,and poor pain control due to endometriosis.Patient will be scheduled for an appointment for consult about this problem.

## 2016-01-27 ENCOUNTER — Telehealth: Payer: Self-pay | Admitting: *Deleted

## 2016-01-27 NOTE — Telephone Encounter (Signed)
Pt called to office needing change in paperwork. Return call to pt. Pt states that she will need this week added to her paperwork for work stating coverage due to current condition. Pt states she need 8/21,24,25 added.  Pt advised that dates will be added if approved and paperwork sent.

## 2016-01-31 NOTE — Telephone Encounter (Signed)
Pt has been contacted regarding this.

## 2016-02-07 ENCOUNTER — Ambulatory Visit: Payer: Self-pay | Admitting: Obstetrics and Gynecology

## 2016-02-14 ENCOUNTER — Ambulatory Visit: Payer: Self-pay | Admitting: Obstetrics and Gynecology

## 2016-02-21 ENCOUNTER — Ambulatory Visit: Payer: 59 | Admitting: Obstetrics and Gynecology

## 2016-02-28 ENCOUNTER — Ambulatory Visit: Payer: Self-pay | Admitting: Obstetrics & Gynecology

## 2016-03-01 ENCOUNTER — Ambulatory Visit (INDEPENDENT_AMBULATORY_CARE_PROVIDER_SITE_OTHER): Payer: 59 | Admitting: Obstetrics and Gynecology

## 2016-03-01 VITALS — BP 140/103 | HR 85 | Temp 98.1°F | Wt 195.3 lb

## 2016-03-01 DIAGNOSIS — N809 Endometriosis, unspecified: Secondary | ICD-10-CM

## 2016-03-01 DIAGNOSIS — R030 Elevated blood-pressure reading, without diagnosis of hypertension: Secondary | ICD-10-CM | POA: Diagnosis not present

## 2016-03-01 DIAGNOSIS — N898 Other specified noninflammatory disorders of vagina: Secondary | ICD-10-CM | POA: Diagnosis not present

## 2016-03-01 MED ORDER — ETHYNODIOL DIAC-ETH ESTRADIOL 1-35 MG-MCG PO TABS: 1.0000 | ORAL_TABLET | Freq: Every day | ORAL | 11 refills | Status: DC

## 2016-03-01 MED ORDER — OXYCODONE-ACETAMINOPHEN 5-325 MG PO TABS: 1.0000 | ORAL_TABLET | ORAL | 0 refills | Status: AC | PRN

## 2016-03-01 NOTE — Progress Notes (Signed)
Pt seen for follow up of chronic pelvic pain due to endometriosis.  Pt was Dx in 2014 via Dx with visible endometrial implants and abd/pelvic adhesive disease. Reports pain improved after surgery. However has had problems for the last several months. Currently uses OCP's Motrin and PRN Percocet. Has not used Percocet in several months. But when she takes it does help with the pain Reports pain is daily and worsens with cycles. No cycle in June but otherwise regular cycles. She denies any bowel or bladder dysfunction.  She does C/O some external vaginal irritation as well. Last IC several days ago without problems and with same partner.   PE  AF VSS Lungs clear Heart RRR Abd soft + BS Pelvic small perineal abrasions noted, small pea size left labial cyst, tender to touch, scant white discharge Uterus small, slight decreased mobility, no adnexal masses or tenderness.  Procedure. Small stab incision with 23 gauge needle was made in cyst. Small amount of purulent discharge expressed. Pt tolerated well.   A/P Endometriosis/chronic pelvic pain        Vaginal Discharge         Labial cyst  Tx options for endometriosis reviewed with pt. Will complete current pack of OCP's and then start continuous OCP's (zovia). Elevated BP note but suspect related to pain. Rx for Percocet # 30 no refills and follow up in 1 week for nurse visit to check blood pressure.  Will also check GYN U/S. Nuswab today for discharge. Wound care for cyst reviewed.  Will follow up in 3 months or sooner pending test results and blood pressure.

## 2016-03-01 NOTE — Patient Instructions (Signed)
Endometriosis Endometriosis is a condition in which the tissue that lines the uterus (endometrium) grows outside of its normal location. The tissue may grow in many locations close to the uterus, but it commonly grows on the ovaries, fallopian tubes, vagina, or bowel. Because the uterus expels, or sheds, its lining every menstrual cycle, there is bleeding wherever the endometrial tissue is located. This can cause pain because blood is irritating to tissues not normally exposed to it.  CAUSES  The cause of endometriosis is not known.  SIGNS AND SYMPTOMS  Often, there are no symptoms. When symptoms are present, they can vary with the location of the displaced tissue. Various symptoms can occur at different times. Although symptoms occur mainly during a woman's menstrual period, they can also occur midcycle and usually stop with menopause. Some people may go months with no symptoms at all. Symptoms may include:   Back or abdominal pain.   Heavier bleeding during periods.   Pain during intercourse.   Painful bowel movements.   Infertility. DIAGNOSIS  Your health care provider will do a physical exam and ask about your symptoms. Various tests may be done, such as:   Blood tests and urine tests. These are done to help rule out other problems.   Ultrasound. This test is done to look for abnormal tissue.   An X-ray of the lower bowel (barium enema).  Laparoscopy. In this procedure, a thin, lighted tube with a tiny camera on the end (laparoscope) is inserted into your abdomen. This helps your health care provider look for abnormal tissue to confirm the diagnosis. The health care provider may also remove a small piece of tissue (biopsy) from any abnormal tissue found. This tissue sample can then be sent to a lab so it can be looked at under a microscope. TREATMENT  Treatment will vary and may include:   Medicines to relieve pain. Nonsteroidal anti-inflammatory drugs (NSAIDs) are a type of  pain medicine that can help to relieve the pain caused by endometriosis.  Hormonal therapy. When using hormonal therapy, periods are eliminated. This eliminates the monthly exposure to blood by the displaced endometrial tissue.   Surgery. Surgery may sometimes be done to remove the abnormal endometrial tissue. In severe cases, surgery may be done to remove the fallopian tubes, uterus, and ovaries (hysterectomy). HOME CARE INSTRUCTIONS   Take all medicines as directed by your health care provider. Do not take aspirin because it may increase bleeding when you are not on hormonal therapy.   Avoid activities that produce pain, including sexual activity. SEEK MEDICAL CARE IF:  You have pelvic pain before, after, or during your periods.  You have pelvic pain between periods that gets worse during your period.  You have pelvic pain during or after sex.  You have pelvic pain with bowel movements or urination, especially during your period.  You have problems getting pregnant.  You have a fever. SEEK IMMEDIATE MEDICAL CARE IF:   Your pain is severe and is not responding to pain medicine.   You have severe nausea and vomiting, or you cannot keep foods down.   You have pain that is limited to the right lower part of your abdomen.   You have swelling or increasing pain in your abdomen.   You see blood in your stool.  MAKE SURE YOU:   Understand these instructions.  Will watch your condition.  Will get help right away if you are not doing well or get worse.   This information   is not intended to replace advice given to you by your health care provider. Make sure you discuss any questions you have with your health care provider.   Document Released: 05/18/2000 Document Revised: 06/11/2014 Document Reviewed: 01/16/2013 Elsevier Interactive Patient Education 2016 [employer]  

## 2016-03-02 NOTE — Addendum Note (Signed)
Addended by: Tamela Oddi on: 03/02/2016 02:26 PM   Modules accepted: Orders

## 2016-03-05 LAB — NUSWAB VG, CANDIDA 6SP
CANDIDA ALBICANS, NAA: POSITIVE — AB
CANDIDA LUSITANIAE, NAA: NEGATIVE
CANDIDA PARAPSILOSIS, NAA: NEGATIVE
Candida glabrata, NAA: NEGATIVE
Candida krusei, NAA: NEGATIVE
Candida tropicalis, NAA: POSITIVE — AB
TRICH VAG BY NAA: NEGATIVE

## 2016-03-07 ENCOUNTER — Ambulatory Visit (HOSPITAL_COMMUNITY)
Admission: RE | Admit: 2016-03-07 | Discharge: 2016-03-07 | Disposition: A | Discharge status: Home / Self Care | Payer: 59 | Source: Ambulatory Visit | Attending: Obstetrics and Gynecology | Admitting: Obstetrics and Gynecology

## 2016-03-07 DIAGNOSIS — N809 Endometriosis, unspecified: Secondary | ICD-10-CM | POA: Diagnosis not present

## 2016-03-07 DIAGNOSIS — R102 Pelvic and perineal pain: Secondary | ICD-10-CM | POA: Insufficient documentation

## 2016-03-08 ENCOUNTER — Inpatient Hospital Stay (HOSPITAL_COMMUNITY)
Admission: AD | Admit: 2016-03-08 | Discharge: 2016-03-09 | Discharge status: Against Medical Advice | Payer: 59 | Source: Ambulatory Visit | Attending: Obstetrics & Gynecology | Admitting: Obstetrics & Gynecology

## 2016-03-08 ENCOUNTER — Encounter (HOSPITAL_COMMUNITY): Payer: Self-pay | Admitting: Obstetrics and Gynecology

## 2016-03-08 DIAGNOSIS — N898 Other specified noninflammatory disorders of vagina: Secondary | ICD-10-CM | POA: Diagnosis not present

## 2016-03-08 DIAGNOSIS — Z5320 Procedure and treatment not carried out because of patient's decision for unspecified reasons: Secondary | ICD-10-CM | POA: Insufficient documentation

## 2016-03-08 LAB — URINE MICROSCOPIC-ADD ON

## 2016-03-08 LAB — POCT PREGNANCY, URINE: Preg Test, Ur: NEGATIVE

## 2016-03-08 LAB — URINALYSIS, ROUTINE W REFLEX MICROSCOPIC
Bilirubin Urine: NEGATIVE
Glucose, UA: >1000 mg/dL — AB
HGB URINE DIPSTICK: NEGATIVE
Ketones, ur: NEGATIVE mg/dL
Leukocytes, UA: NEGATIVE
NITRITE: NEGATIVE
PH: 6 (ref 5.0–8.0)
Protein, ur: NEGATIVE mg/dL
SPECIFIC GRAVITY, URINE: 1.01 (ref 1.005–1.030)

## 2016-03-08 NOTE — MAU Note (Signed)
Pt c/o vaginal irritation and itching x1 month. States that she saw Dr Rip Harbour, did swabs but hasn't heard results. Was told to put A&D ointment, but it is not helping. States that Dr Rip Harbour mentioned that he saw some small cysts on outside and inside vagina. Now has a small bump on right middle finger. Denies burning or pain with urination, but has an increase in frequency. LMP: 01/24/2016. Hx of endometriosis and irregular periods.

## 2016-03-09 ENCOUNTER — Telehealth: Payer: Self-pay

## 2016-03-09 DIAGNOSIS — B379 Candidiasis, unspecified: Secondary | ICD-10-CM

## 2016-03-09 MED ORDER — FLUCONAZOLE 150 MG PO TABS: 150.0000 mg | ORAL_TABLET | Freq: Once | ORAL | 0 refills | Status: AC

## 2016-03-09 NOTE — Telephone Encounter (Signed)
Called patient and informed of lab results. Informed patient about medication being sent to pharmacy and instructions patient agreed.

## 2016-03-09 NOTE — MAU Note (Signed)
Not in lobby x 3  

## 2016-03-09 NOTE — MAU Note (Signed)
Not in lobby x1  

## 2016-03-09 NOTE — MAU Note (Signed)
Not in lobby x2.

## 2016-03-15 ENCOUNTER — Encounter: Payer: Self-pay | Admitting: Obstetrics and Gynecology

## 2016-03-15 ENCOUNTER — Ambulatory Visit (INDEPENDENT_AMBULATORY_CARE_PROVIDER_SITE_OTHER): Payer: 59 | Admitting: Obstetrics and Gynecology

## 2016-03-15 ENCOUNTER — Encounter: Payer: Self-pay | Admitting: *Deleted

## 2016-03-15 VITALS — BP 137/93 | HR 77 | Ht 61.0 in | Wt 193.0 lb

## 2016-03-15 DIAGNOSIS — N809 Endometriosis, unspecified: Secondary | ICD-10-CM | POA: Diagnosis not present

## 2016-03-15 DIAGNOSIS — R102 Pelvic and perineal pain: Secondary | ICD-10-CM

## 2016-03-15 DIAGNOSIS — B373 Candidiasis of vulva and vagina: Secondary | ICD-10-CM | POA: Diagnosis not present

## 2016-03-15 DIAGNOSIS — N907 Vulvar cyst: Secondary | ICD-10-CM | POA: Diagnosis not present

## 2016-03-15 DIAGNOSIS — G8929 Other chronic pain: Secondary | ICD-10-CM

## 2016-03-15 DIAGNOSIS — B3731 Acute candidiasis of vulva and vagina: Secondary | ICD-10-CM

## 2016-03-15 NOTE — Progress Notes (Signed)
Obstetrics and Gynecology Visit Return Patient Evaluation  Appointment Date: 03/15/2016  OBGYN Clinic: [facility]  Primary Care Provider: Ranae Palms  Chief Complaint:  Follow up endometriosis and left labial cyst  History of Present Illness: Samantha Lee is a 30 y.o. African-American [phone] (Patient's last menstrual period was 01/22/2016.), seen for the above chief complaint. Her past medical history is significant for history of l/s confirmed endo, BMI 37, c-section x 3, h/o GDM, ?HTN.  Patient seen by Dr. Rip Harbour on 9/28 for abdominal pain and endometriosis follow up, vaginal discharge and a labia cyst. At that time, he ordered her to change her cyclic OCPs and use them continuously (Zovia), refilled her percocet, ordered an u/s and nuswab and I&D a left labia cyst. Her nuswab showed two types of yeast and her u/s was negative. She went to the MAU on 10/5 for vaginal irritation but it looks like from the notes she LWBS  Vaginal irritation: she states the diflucan x 1 she received from him got rid of the vaginal discharge and irritation. She did not have to take the second dose  Left labial cyst: she states that the one he drained resolved but that the one he didn't I&D, at the 5 o'clock position on the left labia, is now bigger and more uncomfortable and a new one is apparent at around 1 o'clock near the clitoral hood. She states she's never had these bumps before her presentation last time  Abdominal pain: abdominal pain continues and no change in severity. She states they feel like menstrual cramps and occurs qday and is starting to impact her ADLs. No AUB or intermenstrual bleeding.   No fevers, chills, chest pain, SOB, nausea, vomiting, abdominal pain, dysuria, hematuria, vaginal itching, dyspareunia, diarrhea, constipation, blood in BMs  Review of Systems: Her 12 point review of systems is negative or as noted in the History of Present Illness.  Patient Active Problem List   Diagnosis Date Noted  . Labial cyst 03/15/2016  . Endometriosis 01/28/2013  . Routine gynecological examination 11/28/2012    Past Medical History:  Past Medical History:  Diagnosis Date  . Endometriosis   . Gestational diabetes   . Headache(784.0)    otc med prn  . PONV (postoperative nausea and vomiting)   . Preterm labor     Past Surgical History:  Past Surgical History:  Procedure Laterality Date  . CESAREAN SECTION     x 2  . CESAREAN SECTION N/A 05/29/2014   Procedure: CESAREAN SECTION;  Surgeon: Lahoma Crocker, MD;  Location: [facility];  Service: Obstetrics;  Laterality: N/A;  . DILATION AND CURETTAGE OF UTERUS     mab  . LAPAROSCOPIC LYSIS OF ADHESIONS N/A 01/15/2013   Procedure: LAPAROSCOPIC LYSIS OF ADHESIONS;  Surgeon: Lahoma Crocker, MD;  Location: [facility];  Service: Gynecology;  Laterality: N/A;  with ablation of endometriotic implants   . LAPAROSCOPY N/A 01/15/2013   Procedure: LAPAROSCOPY DIAGNOSTIC  ;  Surgeon: Lahoma Crocker, MD;  Location: [facility];  Service: Gynecology;  Laterality: N/A;  . LAPAROSCOPY Right 01/15/2013   Procedure: LAPAROSCOPY OPERATIVE  ;  Surgeon: Lahoma Crocker, MD;  Location: [facility];  Service: Gynecology;  Laterality: Right;  R sidewall biopsy    Past Obstetrical History:  OB History  Gravida Para Term Preterm AB Living  4 4 3 1  0 3  SAB TAB Ectopic Multiple Live Births  0     0 4    # Outcome Date GA Lbr Len/2nd Weight  Sex Delivery Anes PTL Lv  4 Term 05/29/14 [redacted]w[redacted]d  8 lb 9.2 oz (3.89 kg) F CS-LTranv Spinal  LIV  3 Term 08/18/07 [redacted]w[redacted]d  6 lb 4 oz (2.835 kg) F CS-LTranv Spinal N LIV  2 Term 07/21/03 [redacted]w[redacted]d  6 lb (2.722 kg) F CS-LTranv Spinal Y LIV  1 Preterm 2003        ND      Past Gynecological History: As per HPI. Pap neg 2016  Social History:  Social History   Social History  . Marital status: Married    Spouse name: N/A  . Number of children: N/A  . Years of education: N/A   Occupational History  . Not on  file.   Social History Main Topics  . Smoking status: Never Smoker  . Smokeless tobacco: Never Used  . Alcohol use No  . Drug use: No  . Sexual activity: Yes    Partners: Male    Birth control/ protection: None   Other Topics Concern  . Not on file   Social History Narrative  . No narrative on file    Family History:  Family History  Problem Relation Age of Onset  . Hypertension Mother     Medications Samantha Lee had no medications administered during this visit. Current Outpatient Prescriptions  Medication Sig Dispense Refill  . ethynodiol-ethinyl estradiol (KELNOR,ZOVIA) 1-35 MG-MCG tablet Take 1 tablet by mouth daily. Pt to take continuously for suppression of endometriosis. 1 Package 11  . ibuprofen (ADVIL,MOTRIN) 800 MG tablet Take 1 tablet (800 mg total) by mouth every 8 (eight) hours as needed. 30 tablet 5  . oxyCODONE-acetaminophen (PERCOCET/ROXICET) 5-325 MG tablet Take 1 tablet by mouth every 4 (four) hours as needed for severe pain. 20 tablet 0   No current facility-administered medications for this visit.     Allergies Review of patient's allergies indicates no known allergies.   Physical Exam:  BP (!) 137/93   Pulse 77   Ht 5\' 1"  (1.549 m)   Wt 193 lb (87.5 kg)   LMP 01/22/2016   Breastfeeding? No   BMI 36.47 kg/m  Body mass index is 36.47 kg/m. General appearance: Well nourished, well developed female in no acute distress.  Cardiovascular: normal s1 and s2.  No murmurs, rubs or gallops. Respiratory:  Clear to auscultation bilateral. Normal respiratory effort Abdomen: positive bowel sounds and no masses, hernias; diffusely non tender to palpation, non distended. Well healed low transverse skin incision Neuro/Psych:  Normal mood and affect.  Skin:  Warm and dry.  Lymphatic:  No inguinal lymphadenopathy.   Pelvic exam: is not limited by body habitus 37mm x 23mm cyst felt at around the 1 o'clock position near the left of the clitoral hood. Slightly  ttp. No skin changes Area at 2 o'clock of the left labia at the prior I&D is normal and well healed Area at 4-5 o'clock on the left labia shows about a 2x2cm slightly indurated area that is slightly ttp. No distinct fluctuance or fluid felt.  Mild erythema at the posterior fourchette and labia minora c/w VVC  Laboratory: as above. +glucosuria at MAU u/a  Radiology:  CLINICAL DATA:  Endometriosis, chronic pelvic pain, prior Caesarean section  EXAM: TRANSABDOMINAL AND TRANSVAGINAL ULTRASOUND OF PELVIS  TECHNIQUE: Both transabdominal and transvaginal ultrasound examinations of the pelvis were performed. Transabdominal technique was performed for global imaging of the pelvis including uterus, ovaries, adnexal regions, and pelvic cul-de-sac. It was necessary to proceed with endovaginal exam following the transabdominal  exam to visualize the endometrium.  COMPARISON:  Pelvic ultrasound 12/12/2012  FINDINGS: Uterus  Measurements: 9.2 x 4.0 x 4.6 cm. Retroflexed.  No focal mass lesion  Endometrium  Thickness: 7 mm thick. Complex hypoechoic material within endometrial canal extending to what appears to be a Caesarean section scar, material appears mobile, question blood  Right ovary  Measurements: 2.1 x 1.41.4 cm. Normal morphology without mass  Left ovary  Measurements: 3.0 x 2.2 x 1.9 cm. Normal morphology without mass  Other findings  No free pelvic fluid or adnexal masses.  IMPRESSION: Complex hypoechoic material likely blood/fluid within endometrial canal extending to a more prominent collection at what appears to be a Caesarean section scar.  Remainder of exam unremarkable.   Electronically Signed   By: Lavonia Dana M.D.   On: 03/07/2016 11:54  Assessment: left labial cysts and chronic abdominal pain. Pt stable  Plan:  *CPP: I told her that she's in a difficult situation given her history, since she has two main possible culprits for her  pain: endometriosis or pelvic adhesive disease, both of which have been proven by surgery. I told her that trial of Depot Lupron could be helpful in possibly ruling out endo has a possible cause, if the pain goes away or is significantly better on Lupron then it points more in the endo direction and if not, then interventions to eliminate endo, such as surgery, may not be as useful. She states that she's tried Lupron prior to her d/x l/s and it caused her to "lose her head" and she didn't like the SE.   Given what we know now, I told her that she has two main routes: surgical and/or medical management. I told her that if she chose any surgical management that she'd have to be on some kind of progestin therapy to suppress any endometriosis post operatively. I told her that another d/x l/s may be useful in helping her s/s, and she states Dr. Rip Harbour d/w her this option at her last visit. She is done with children but she states her husband isn't sure and I told her to seriously think about this, given could do BTL, if she wants to preserve her uterus or d/w her surgeon re: utility of definitive surgical procedure with caveat that it may not cure her CPP. I told her that her hyst at this point isn't necessarily recommended but was just d/w her for completeness sake since I'm not her primary GYN. See below for medical options *Left labial cysts: too small for intervention now but I told her that at follow up that may be able to I&D them. 78m time slot requested *VVC: recommended monistat 7. One type of yeast seen at last swab (tropicalis) has had some resistance and monistat or diflucan may not cover. Can f/u skin exam at [facility] *Primary care: pt still with mild range BP and none in the past prior to the past few months. She is on a 71mcg EE pill. I told her that I do recommend coming off it, given the rise in BP, but can delay this until her f/u visit and what she'd like to do. I told her there is some evidence that  Mirena or Depo Provera could help with endo s/s and she could get either of those in the office next time if she'd like or if she doesn't want to do those and/or do some type of surgery that I'd switch her to POP Also, she's never had a 2hr GTT  and had glucose in her urine at her most recent MAU visit. She needs to be set up for a fasting 2hr at some point  RTC 1-2wks  Aletha Halim, Brooke Bonito MD Attending Center for [employer] [facility])

## 2016-03-15 NOTE — Progress Notes (Signed)
Pt here to follow up on abdominal pain/endometriosis and vaginal cyst.

## 2016-03-19 ENCOUNTER — Ambulatory Visit: Payer: 59 | Admitting: Obstetrics and Gynecology

## 2016-05-04 ENCOUNTER — Other Ambulatory Visit: Payer: Self-pay | Admitting: Obstetrics

## 2016-05-21 DIAGNOSIS — Z3483 Encounter for supervision of other normal pregnancy, third trimester: Secondary | ICD-10-CM

## 2016-05-23 ENCOUNTER — Encounter: Payer: Self-pay | Admitting: *Deleted

## 2016-05-23 ENCOUNTER — Telehealth: Payer: Self-pay

## 2016-05-23 NOTE — Telephone Encounter (Signed)
Called and informed that paperwork was ready for pick up.

## 2016-06-14 ENCOUNTER — Ambulatory Visit (INDEPENDENT_AMBULATORY_CARE_PROVIDER_SITE_OTHER): Payer: 59 | Admitting: Obstetrics & Gynecology

## 2016-07-17 ENCOUNTER — Ambulatory Visit: Payer: 59 | Admitting: Obstetrics & Gynecology

## 2016-07-30 ENCOUNTER — Other Ambulatory Visit: Payer: Self-pay | Admitting: Obstetrics

## 2016-07-30 DIAGNOSIS — N946 Dysmenorrhea, unspecified: Secondary | ICD-10-CM

## 2016-07-31 ENCOUNTER — Ambulatory Visit: Payer: 59 | Admitting: Obstetrics and Gynecology

## 2016-08-16 ENCOUNTER — Ambulatory Visit (INDEPENDENT_AMBULATORY_CARE_PROVIDER_SITE_OTHER): Payer: 59 | Admitting: Obstetrics and Gynecology

## 2016-08-16 VITALS — BP 138/85 | HR 93 | Wt 194.5 lb

## 2016-08-16 DIAGNOSIS — R102 Pelvic and perineal pain: Secondary | ICD-10-CM

## 2016-08-16 DIAGNOSIS — G8929 Other chronic pain: Secondary | ICD-10-CM | POA: Diagnosis not present

## 2016-08-16 DIAGNOSIS — N809 Endometriosis, unspecified: Secondary | ICD-10-CM | POA: Diagnosis not present

## 2016-08-16 MED ORDER — NAPROXEN SODIUM 550 MG PO TABS: 550.0000 mg | ORAL_TABLET | Freq: Two times a day (BID) | ORAL | 2 refills | Status: AC

## 2016-08-16 NOTE — Progress Notes (Signed)
Pt here for follow up of her CPP and endometriosis. She has been doing OK with continuously OCP's (Zovia) . Occ BTB. Still problems with pain. Using Motrin and PRN Percocet.   See previous office notes for past surgeries and treatment  She desires to proceed with DX lap with possible ablation of endometriosis.   PE AF VSS Lungs clear Heart RRR Abd soft + BS GU deferred  A/P CPP        Endometriosis  DX lap reviewed with pt. Pt declines BTL at this time. Risks/Benefits and post op care reviewed with pt. Pt informed that surgery may not eliminate her pain. Pt verbalized understanding. Will continue with OCP's, stop Motrin, try Anaprox.  F/U with post op visit.

## 2016-08-16 NOTE — Patient Instructions (Signed)
Diagnostic Laparoscopy A diagnostic laparoscopy is a procedure to diagnose diseases in the abdomen. During the procedure, a thin, lighted, pencil-sized instrument called a laparoscope is inserted into the abdomen through an incision. The laparoscope allows your health care provider to look at the organs inside your body. Tell a health care provider about:  Any allergies you have.  All medicines you are taking, including vitamins, herbs, eye drops, creams, and over-the-counter medicines.  Any problems you or family members have had with anesthetic medicines.  Any blood disorders you have.  Any surgeries you have had.  Any medical conditions you have. What are the risks? Generally, this is a safe procedure. However, problems can occur, which may include:  Infection.  Bleeding.  Damage to other organs.  Allergic reaction to the anesthetics used during the procedure. What happens before the procedure?  Do not eat or drink anything after midnight on the night before the procedure or as directed by your health care provider.  Ask your health care provider about:  Changing or stopping your regular medicines.  Taking medicines such as aspirin and ibuprofen. These medicines can thin your blood. Do not take these medicines before your procedure if your health care provider instructs you not to.  Plan to have someone take you home after the procedure. What happens during the procedure?  You may be given a medicine to help you relax (sedative).  You will be given a medicine to make you sleep (general anesthetic).  Your abdomen will be inflated with a gas. This will make your organs easier to see.  Small incisions will be made in your abdomen.  A laparoscope and other small instruments will be inserted into the abdomen through the incisions.  A tissue sample may be removed from an organ in the abdomen for examination.  The instruments will be removed from the abdomen.  The gas  will be released.  The incisions will be closed with stitches (sutures). What happens after the procedure? Your blood pressure, heart rate, breathing rate, and blood oxygen level will be monitored often until the medicines you were given have worn off. This information is not intended to replace advice given to you by your health care provider. Make sure you discuss any questions you have with your health care provider. Document Released: 08/27/2000 Document Revised: 09/29/2015 Document Reviewed: 01/01/2014 Elsevier Interactive Patient Education  2017 [employer].

## 2016-08-17 ENCOUNTER — Encounter (HOSPITAL_COMMUNITY): Payer: Self-pay

## 2016-10-04 ENCOUNTER — Other Ambulatory Visit: Payer: Self-pay | Admitting: Obstetrics and Gynecology

## 2016-10-08 ENCOUNTER — Telehealth (HOSPITAL_COMMUNITY): Payer: Self-pay

## 2016-10-08 NOTE — Telephone Encounter (Signed)
Received a call from Arbie Cookey in [place] who stated that this patient told Tamika (Pre-Op) that she wanted to reschedule her surgery called patient to confirm, no answer, phone just rings, never rolls to voicemail, unable to leave message.

## 2016-10-09 ENCOUNTER — Encounter: Payer: Self-pay | Admitting: *Deleted

## 2016-10-15 ENCOUNTER — Other Ambulatory Visit (HOSPITAL_COMMUNITY): Payer: 59

## 2016-10-23 ENCOUNTER — Ambulatory Visit: Admit: 2016-10-23 | Payer: 59 | Admitting: Obstetrics and Gynecology

## 2016-10-23 SURGERY — LAPAROSCOPY, DIAGNOSTIC
Anesthesia: Choice

## 2016-10-25 ENCOUNTER — Encounter (HOSPITAL_COMMUNITY): Payer: Self-pay

## 2016-12-07 ENCOUNTER — Telehealth: Payer: Self-pay | Admitting: *Deleted

## 2016-12-07 NOTE — Telephone Encounter (Signed)
Patient is scheduled for surgery 7/24. She has to let her job know how long she will be out so they can give her the appropriate paperwork. Last time she had this done she was out for 3 weeks- how long should she expect to be out of work this time?

## 2016-12-11 ENCOUNTER — Telehealth: Payer: Self-pay | Admitting: *Deleted

## 2016-12-11 NOTE — Telephone Encounter (Signed)
LM on VM- Patient reminded of pre-op instructions. Also informed of out of work plan.

## 2016-12-11 NOTE — Telephone Encounter (Signed)
-----   Message from Chancy Milroy, MD sent at 12/10/2016  9:08 AM EDT ----- Please remind pt to have clear liquids day prior to surgery and to take 1 bottle of magnesium citrate day prior to surgery. Also let pt know that she will be out of work 1 week but this may change pending surgery findings Thanks Legrand Como

## 2016-12-17 NOTE — Patient Instructions (Signed)
Your procedure is scheduled on:  Tuesday, December 25, 2016  Enter through the [employer] [facility] at:  8:00 AM  Pick up the phone at the desk and dial [phone].  Call this number if you have problems the morning of surgery: [phone].  Remember: Do NOT eat food or drink after:  Midnight Monday  Take these medicines the morning of surgery with a SIP OF WATER:  None  Stop ALL herbal medications at this time  Do NOT smoke the day of surgery.  Do NOT wear jewelry (body piercing), metal hair clips/bobby pins, make-up, artifical eyelashes or nail polish. Do NOT wear lotions, powders, or perfumes.  You may wear deodorant. Do NOT shave for 48 hours prior to surgery. Do NOT bring valuables to the hospital. Contacts, dentures, or bridgework may not be worn into surgery.  Have a responsible adult drive you home and stay with you for 24 hours after your procedure  Bring a copy of your healthcare power of attorney and living will documents.

## 2016-12-18 ENCOUNTER — Encounter (HOSPITAL_COMMUNITY): Payer: Self-pay

## 2016-12-18 ENCOUNTER — Encounter (HOSPITAL_COMMUNITY)
Admission: RE | Admit: 2016-12-18 | Discharge: 2016-12-18 | Disposition: A | Discharge status: Home / Self Care | Payer: 59 | Source: Ambulatory Visit | Attending: Obstetrics and Gynecology | Admitting: Obstetrics and Gynecology

## 2016-12-18 DIAGNOSIS — Z01812 Encounter for preprocedural laboratory examination: Secondary | ICD-10-CM | POA: Insufficient documentation

## 2016-12-18 HISTORY — DX: Anemia, unspecified: D64.9

## 2016-12-18 HISTORY — DX: Prediabetes: R73.03

## 2016-12-18 LAB — BASIC METABOLIC PANEL
ANION GAP: 12 (ref 5–15)
BUN: 11 mg/dL (ref 6–20)
CO2: 22 mmol/L (ref 22–32)
Calcium: 9.2 mg/dL (ref 8.9–10.3)
Chloride: 98 mmol/L — ABNORMAL LOW (ref 101–111)
Creatinine, Ser: 0.78 mg/dL (ref 0.44–1.00)
GFR calc Af Amer: >60 mL/min (ref >60)
GLUCOSE: 506 mg/dL — AB (ref 65–99)
POTASSIUM: 4.4 mmol/L (ref 3.5–5.1)
SODIUM: 132 mmol/L — AB (ref 135–145)

## 2016-12-18 LAB — CBC
HEMATOCRIT: 41.1 % (ref 36.0–46.0)
HEMOGLOBIN: 14 g/dL (ref 12.0–15.0)
MCH: 31 pg (ref 26.0–34.0)
MCHC: 34.1 g/dL (ref 30.0–36.0)
MCV: 90.9 fL (ref 78.0–100.0)
Platelets: 339 10*3/uL (ref 150–400)
RBC: 4.52 MIL/uL (ref 3.87–5.11)
RDW: 12.3 % (ref 11.5–15.5)
WBC: 10.2 10*3/uL (ref 4.0–10.5)

## 2016-12-18 NOTE — Pre-Procedure Instructions (Signed)
Dr. Marcie Bal made aware of elevated glucose.  I sent a message via [facility] to Dr. Rip Harbour.

## 2016-12-20 ENCOUNTER — Other Ambulatory Visit: Payer: Self-pay | Admitting: Obstetrics and Gynecology

## 2016-12-20 ENCOUNTER — Telehealth: Payer: Self-pay | Admitting: *Deleted

## 2016-12-20 DIAGNOSIS — N809 Endometriosis, unspecified: Secondary | ICD-10-CM

## 2016-12-20 NOTE — Telephone Encounter (Signed)
Call to patient- notified of finding on pre op labs- and that surgery would be canceled. She will contact Dr Hampton Abbot to do further evaluation and get clearance for the surgery. She will contant [place] when she is ready.

## 2016-12-20 NOTE — Telephone Encounter (Signed)
OK to refill OCP's Thanks Legrand Como

## 2016-12-20 NOTE — Telephone Encounter (Signed)
Please review for refill.  

## 2016-12-20 NOTE — Telephone Encounter (Signed)
-----   Message from Chancy Milroy, MD sent at 12/18/2016  1:24 PM EDT ----- Please let pt know that her blood sugar was extremely elevated and we will need to cancel her surgery. She needs to see her PCP immediately for evaluation of DM. Once she is cleared by her PCP for surgery we can reschedule. Thanks Legrand Como

## 2016-12-25 ENCOUNTER — Ambulatory Visit (HOSPITAL_COMMUNITY): Admission: RE | Admit: 2016-12-25 | Payer: 59 | Source: Ambulatory Visit | Admitting: Obstetrics and Gynecology

## 2016-12-25 ENCOUNTER — Encounter (HOSPITAL_COMMUNITY): Admission: RE | Payer: Self-pay | Source: Ambulatory Visit

## 2016-12-25 SURGERY — LAPAROSCOPY, DIAGNOSTIC
Anesthesia: Choice

## 2017-06-28 ENCOUNTER — Emergency Department (HOSPITAL_COMMUNITY): Payer: 59

## 2017-06-28 ENCOUNTER — Emergency Department (HOSPITAL_COMMUNITY)
Admission: EM | Admit: 2017-06-28 | Discharge: 2017-06-28 | Disposition: A | Discharge status: Home / Self Care | Payer: 59 | Attending: Emergency Medicine | Admitting: Emergency Medicine

## 2017-06-28 ENCOUNTER — Encounter (HOSPITAL_COMMUNITY): Payer: Self-pay

## 2017-06-28 DIAGNOSIS — R1030 Lower abdominal pain, unspecified: Secondary | ICD-10-CM | POA: Diagnosis not present

## 2017-06-28 DIAGNOSIS — R1033 Periumbilical pain: Secondary | ICD-10-CM | POA: Diagnosis not present

## 2017-06-28 DIAGNOSIS — Z79899 Other long term (current) drug therapy: Secondary | ICD-10-CM | POA: Diagnosis not present

## 2017-06-28 DIAGNOSIS — R5082 Postprocedural fever: Secondary | ICD-10-CM | POA: Insufficient documentation

## 2017-06-28 DIAGNOSIS — Z9889 Other specified postprocedural states: Secondary | ICD-10-CM

## 2017-06-28 DIAGNOSIS — R1084 Generalized abdominal pain: Secondary | ICD-10-CM

## 2017-06-28 DIAGNOSIS — R509 Fever, unspecified: Secondary | ICD-10-CM | POA: Diagnosis present

## 2017-06-28 LAB — CBC WITH DIFFERENTIAL/PLATELET
Basophils Absolute: 0 10*3/uL (ref 0.0–0.1)
Basophils Relative: 0 %
EOS PCT: 2 %
Eosinophils Absolute: 0.2 10*3/uL (ref 0.0–0.7)
HCT: 43 % (ref 36.0–46.0)
Hemoglobin: 13.4 g/dL (ref 12.0–15.0)
LYMPHS ABS: 2.4 10*3/uL (ref 0.7–4.0)
LYMPHS PCT: 20 %
MCH: 29.3 pg (ref 26.0–34.0)
MCHC: 31.2 g/dL (ref 30.0–36.0)
MCV: 94.1 fL (ref 78.0–100.0)
MONOS PCT: 3 %
Monocytes Absolute: 0.4 10*3/uL (ref 0.1–1.0)
Neutro Abs: 8.8 10*3/uL — ABNORMAL HIGH (ref 1.7–7.7)
Neutrophils Relative %: 75 %
Platelets: 295 10*3/uL (ref 150–400)
RBC: 4.57 MIL/uL (ref 3.87–5.11)
RDW: 13.1 % (ref 11.5–15.5)
WBC: 11.7 10*3/uL — AB (ref 4.0–10.5)

## 2017-06-28 LAB — URINALYSIS, ROUTINE W REFLEX MICROSCOPIC
BACTERIA UA: NONE SEEN
Bilirubin Urine: NEGATIVE
Glucose, UA: >=500 mg/dL — AB
Hgb urine dipstick: NEGATIVE
KETONES UR: NEGATIVE mg/dL
Leukocytes, UA: NEGATIVE
Nitrite: NEGATIVE
PROTEIN: NEGATIVE mg/dL
Specific Gravity, Urine: 1.026 (ref 1.005–1.030)
pH: 7 (ref 5.0–8.0)

## 2017-06-28 LAB — BASIC METABOLIC PANEL
Anion gap: 11 (ref 5–15)
BUN: 8 mg/dL (ref 6–20)
CHLORIDE: 102 mmol/L (ref 101–111)
CO2: 24 mmol/L (ref 22–32)
Calcium: 8.8 mg/dL — ABNORMAL LOW (ref 8.9–10.3)
Creatinine, Ser: 0.72 mg/dL (ref 0.44–1.00)
GFR calc Af Amer: >60 mL/min (ref >60)
GFR calc non Af Amer: >60 mL/min (ref >60)
GLUCOSE: 146 mg/dL — AB (ref 65–99)
POTASSIUM: 4 mmol/L (ref 3.5–5.1)
SODIUM: 137 mmol/L (ref 135–145)

## 2017-06-28 LAB — PREGNANCY, URINE: PREG TEST UR: NEGATIVE

## 2017-06-28 MED ORDER — SODIUM CHLORIDE 0.9 % IV BOLUS (SEPSIS)
1000.0000 mL | Freq: Once | INTRAVENOUS | Status: AC
  Administered 2017-06-28: 1000 mL via INTRAVENOUS

## 2017-06-28 MED ORDER — MORPHINE SULFATE (PF) 4 MG/ML IV SOLN
4.0000 mg | Freq: Once | INTRAVENOUS | Status: AC
  Administered 2017-06-28: 4 mg via INTRAVENOUS
  Filled 2017-06-28: qty 1

## 2017-06-28 MED ORDER — IOPAMIDOL (ISOVUE-300) INJECTION 61%
100.0000 mL | Freq: Once | INTRAVENOUS | Status: AC | PRN
  Administered 2017-06-28: 100 mL via INTRAVENOUS

## 2017-06-28 MED ORDER — ONDANSETRON HCL 4 MG/2ML IJ SOLN
4.0000 mg | Freq: Once | INTRAMUSCULAR | Status: AC
  Administered 2017-06-28: 4 mg via INTRAVENOUS
  Filled 2017-06-28: qty 2

## 2017-06-28 NOTE — Discharge Instructions (Signed)
Call Dr. Gregor Hams office this morning to arrange a follow-up appointment for this afternoon.  I have spoken with Dr. Lynnette Caffey who is familiar with your situation and is recommending you be seen then.

## 2017-06-28 NOTE — ED Provider Notes (Signed)
[facility] EMERGENCY DEPARTMENT Provider Note   CSN: [id number] Arrival date & time: 06/28/17  0321     History   Chief Complaint Chief Complaint  Patient presents with  . Fever    s/p laparoscopic surgery     HPI Samantha Lee is a 32 y.o. female.  This patient is a 32 year old female with past medical history of endometriosis.  She underwent a laparoscopic ablation procedure on Wednesday for this.  She began this evening feeling chilled with subjective fevers.  She called her surgeon and was told to come here to be evaluated.  She is afebrile upon arrival with a temperature of 99.1.  She does report some burning and pain with urination.  She also reports some chest congestion.  She is also complaining of pain all throughout her abdomen.  She denies any ill contacts.   The history is provided by the patient.  Fever   This is a new problem. The current episode started 3 to 5 hours ago. The problem occurs constantly. The problem has not changed since onset.Her temperature was unmeasured prior to arrival. Temperature source: Subjective. She has tried nothing for the symptoms. The treatment provided no relief.    Past Medical History:  Diagnosis Date  . Anemia    history of  . Endometriosis   . Gestational diabetes   . Headache(784.0)    otc med prn  . PONV (postoperative nausea and vomiting)   . Pre-diabetes   . Preterm labor     Patient Active Problem List   Diagnosis Date Noted  . Chronic female pelvic pain 08/16/2016  . Labial cyst 03/15/2016  . Endometriosis 01/28/2013  . Routine gynecological examination 11/28/2012    Past Surgical History:  Procedure Laterality Date  . CESAREAN SECTION     x 2  . CESAREAN SECTION N/A 05/29/2014   Procedure: CESAREAN SECTION;  Surgeon: Lahoma Crocker, MD;  Location: [facility];  Service: Obstetrics;  Laterality: N/A;  . DILATION AND CURETTAGE OF UTERUS     mab  . LAPAROSCOPIC LYSIS OF ADHESIONS N/A 01/15/2013   Procedure: LAPAROSCOPIC LYSIS OF ADHESIONS;  Surgeon: Lahoma Crocker, MD;  Location: [facility];  Service: Gynecology;  Laterality: N/A;  with ablation of endometriotic implants   . LAPAROSCOPY N/A 01/15/2013   Procedure: LAPAROSCOPY DIAGNOSTIC  ;  Surgeon: Lahoma Crocker, MD;  Location: [facility];  Service: Gynecology;  Laterality: N/A;  . LAPAROSCOPY Right 01/15/2013   Procedure: LAPAROSCOPY OPERATIVE  ;  Surgeon: Lahoma Crocker, MD;  Location: [facility];  Service: Gynecology;  Laterality: Right;  R sidewall biopsy    OB History    Gravida Para Term Preterm AB Living   4 4 3 1  0 3   SAB TAB Ectopic Multiple Live Births   0     0 4       Home Medications    Prior to Admission medications   Medication Sig Start Date End Date Taking? Authorizing Provider  cephALEXin (KEFLEX) 500 MG capsule Take 500 mg by mouth 2 (two) times daily.    [provider]  ibuprofen (ADVIL,MOTRIN) 800 MG tablet Take 800 mg by mouth 2 (two) times daily.    [provider]  Marliss Coots 1/35 1-35 MG-MCG tablet TAKE 1 TABLET BY MOUTH DAILY. PT TO TAKE CONTINUOUSLY SKIP PLACEBO PILLS. 12/20/16   Chancy Milroy, MD  naproxen sodium (ANAPROX DS) 550 MG tablet Take 1 tablet (550 mg total) by mouth 2 (two) times daily with a  meal. Patient not taking: Reported on 12/18/2016 08/16/16   Chancy Milroy, MD  oxyCODONE-acetaminophen (PERCOCET/ROXICET) 5-325 MG tablet Take 1 tablet by mouth every 4 (four) hours as needed for severe pain. 03/01/16   Chancy Milroy, MD    Family History Family History  Problem Relation Age of Onset  . Hypertension Mother     Social History Social History   Tobacco Use  . Smoking status: Never Smoker  . Smokeless tobacco: Never Used  Substance Use Topics  . Alcohol use: Yes    Alcohol/week: 0.0 oz    Comment: occ  . Drug use: No     Allergies   Patient has no known allergies.   Review of Systems Review of Systems  Constitutional: Positive for fever.  All  other systems reviewed and are negative.    Physical Exam Updated Vital Signs BP 131/87 (BP Location: Right Arm)   Pulse 92   Temp 99.1 F (37.3 C) (Oral)   Resp 20   Ht 5\' 1"  (1.549 m)   Wt 80.7 kg (178 lb)   SpO2 98%   BMI 33.63 kg/m   Physical Exam  Constitutional: She is oriented to person, place, and time. She appears well-developed and well-nourished. No distress.  HENT:  Head: Normocephalic and atraumatic.  Mouth/Throat: Oropharynx is clear and moist.  Neck: Normal range of motion. Neck supple.  Cardiovascular: Normal rate and regular rhythm. Exam reveals no gallop and no friction rub.  No murmur heard. Pulmonary/Chest: Effort normal and breath sounds normal. No respiratory distress. She has no wheezes.  Abdominal: Soft. Bowel sounds are normal. She exhibits no distension. There is tenderness. There is no rebound and no guarding.  There are 2 laparoscopic incisions noted to the abdomen, one at the umbilicus, and one in the suprapubic region.  These both appear to be healing appropriately.  There is no drainage or surrounding erythema.  Her abdomen is tender in all 4 quadrants with no peritoneal signs.  Musculoskeletal: Normal range of motion.  Neurological: She is alert and oriented to person, place, and time.  Skin: Skin is warm and dry. She is not diaphoretic.  Nursing note and vitals reviewed.    ED Treatments / Results  Labs (all labs ordered are listed, but only abnormal results are displayed) Labs Reviewed  BASIC METABOLIC PANEL  CBC WITH DIFFERENTIAL/PLATELET  URINALYSIS, ROUTINE W REFLEX MICROSCOPIC    EKG  EKG Interpretation None       Radiology No results found.  Procedures Procedures (including critical care time)  Medications Ordered in ED Medications  sodium chloride 0.9 % bolus 1,000 mL (not administered)  morphine 4 MG/ML injection 4 mg (not administered)  ondansetron (ZOFRAN) injection 4 mg (not administered)     Initial  Impression / Assessment and Plan / ED Course  I have reviewed the triage vital signs and the nursing notes.  Pertinent labs & imaging results that were available during my care of the patient were reviewed by me and considered in my medical decision making (see chart for details).  Patient presents with fever and multiple other complaints 1-1/2 days after a laparoscopic procedure for endometriosis.  She is afebrile here with only a very mild white count.  I see no obvious source for her fever.  Her urine is clear, chest x-ray is negative, but there are findings on abdominal CT that are potentially significant, however are consistent with prior ultrasound.  I discussed these findings with Dr. Lynnette Caffey who is on  call for Dr. Corinna Capra.  She is recommending the patient follow-up in the office today.  She will call to make these arrangements.  At this point she appears safe for discharge with outpatient follow-up this afternoon.  Final Clinical Impressions(s) / ED Diagnoses   Final diagnoses:  None    ED Discharge Orders    None       Veryl Speak, MD 06/28/17 [phone]

## 2017-06-28 NOTE — ED Notes (Signed)
Pt states last night she noticed a streak of blood in her urine and says "it burns" when urinating.

## 2017-06-28 NOTE — ED Notes (Signed)
Pt has Scopolamine patch behind left ear for nausea and states it does not help or provide any relief of symptoms. Pt states she has been taking Percocets for pain q4h, and has no relief from pain. Last dose take Patient reports was apprx at midnight (0000-06/28/2017).

## 2017-06-28 NOTE — ED Triage Notes (Signed)
Pt has laparoscopic surgery with Dr Corinna Capra on Wednesday.  Pt is not sure what was found at that time.  Pt c/o body aches, chills, fever, nausea, dysuria onset yesterday

## 2017-08-26 ENCOUNTER — Other Ambulatory Visit: Payer: Self-pay | Admitting: Obstetrics and Gynecology

## 2017-08-26 DIAGNOSIS — N809 Endometriosis, unspecified: Secondary | ICD-10-CM

## 2017-10-28 ENCOUNTER — Other Ambulatory Visit: Payer: Self-pay | Admitting: Obstetrics

## 2018-05-12 ENCOUNTER — Other Ambulatory Visit: Payer: Self-pay | Admitting: Obstetrics

## 2018-05-12 DIAGNOSIS — N809 Endometriosis, unspecified: Secondary | ICD-10-CM

## 2018-06-03 IMAGING — CT CT ABD-PELV W/ CM
2 of 4 series · 16 of 46 positions shown, 18 images · IV contrast (Isovue)
Comparison: Ultrasound pelvis 03/07/2016

CLINICAL DATA: Postoperative laparoscopic surgery on [REDACTED].
Patient complains of body aches, chills, fever, nausea, and dysuria
since yesterday.

EXAM:
CT ABDOMEN AND PELVIS WITH CONTRAST
TECHNIQUE: Multidetector CT imaging of the abdomen and pelvis was performed
using the standard protocol following bolus administration of
intravenous contrast.
CONTRAST:  100mL [id number] IOPAMIDOL ([id number]) INJECTION 61%

[Series 2: axial st · axial · 0.85mm/px · z∈[-399,-9]mm · 13 of 91 slices shown, 15 images]
[im 7/91  soft-tissue]
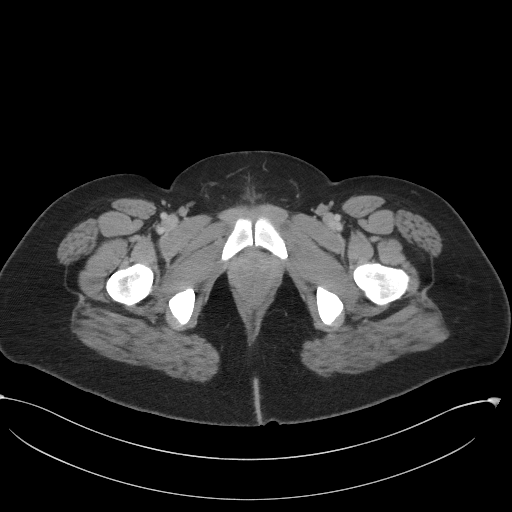
[im 7/91  bone]
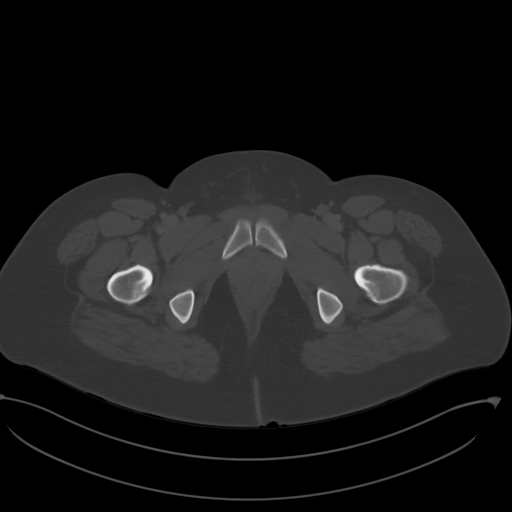
[im 13/91  soft-tissue]
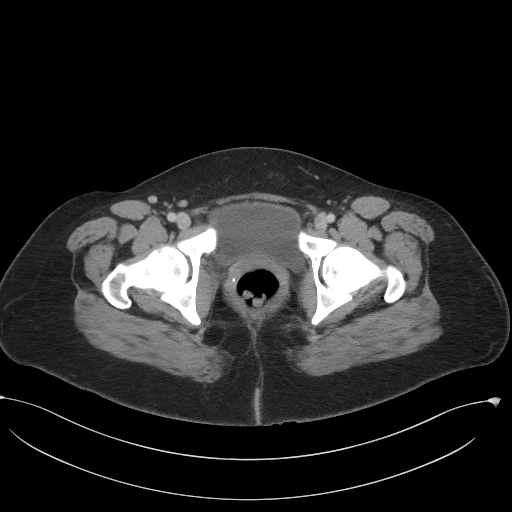
[im 19/91  soft-tissue]
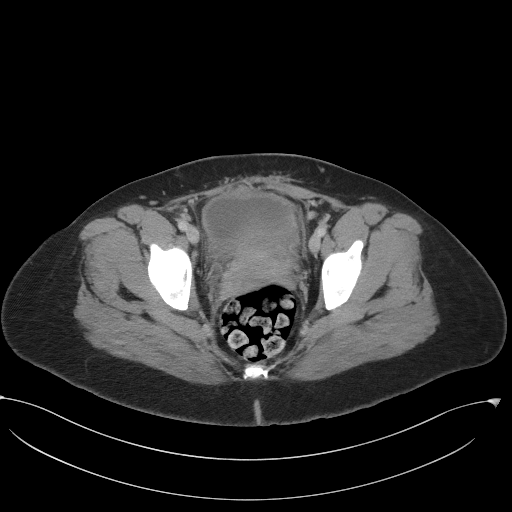
[im 25/91  soft-tissue]
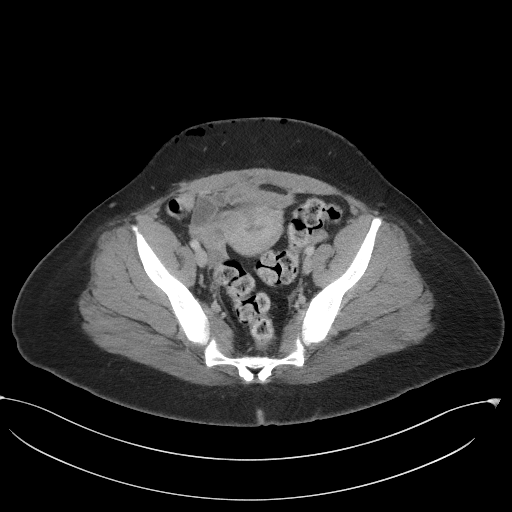
[im 31/91  soft-tissue]
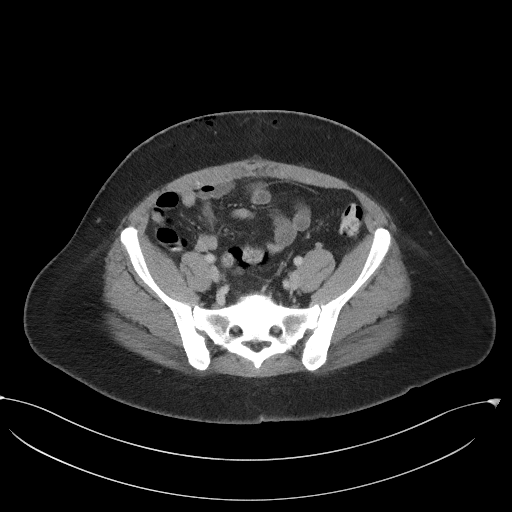
[im 37/91  soft-tissue]
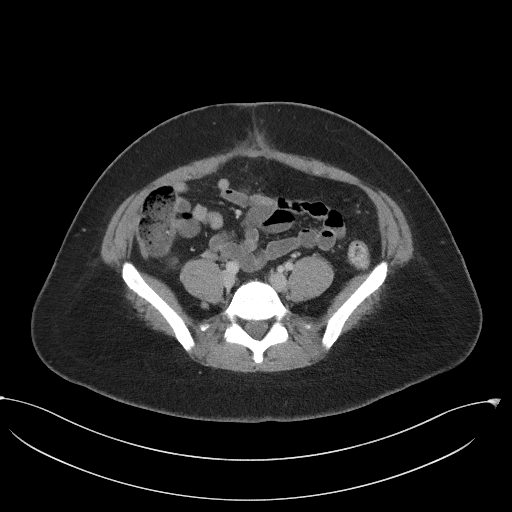
[im 49/91  soft-tissue]
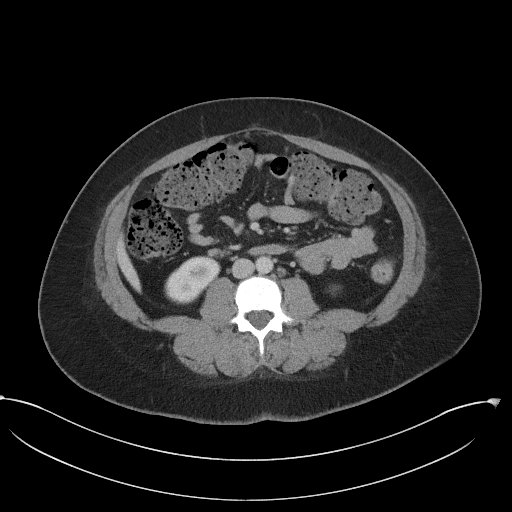
[im 55/91  soft-tissue]
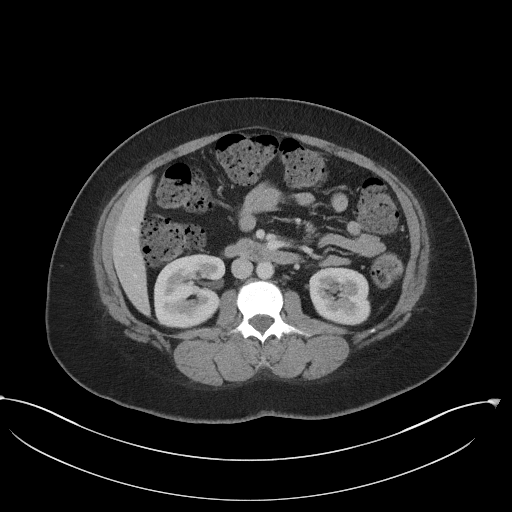
[im 61/91  soft-tissue]
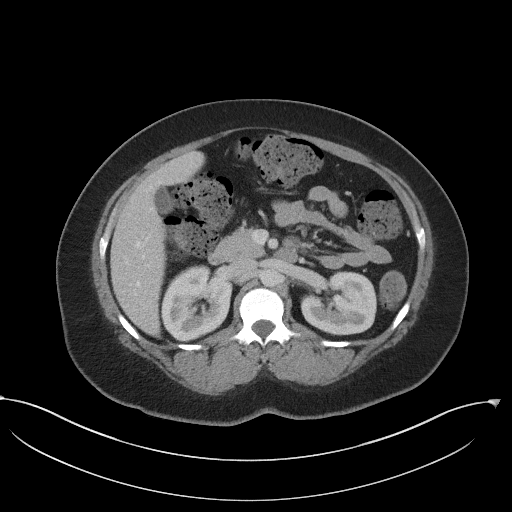
[im 61/91  bone]
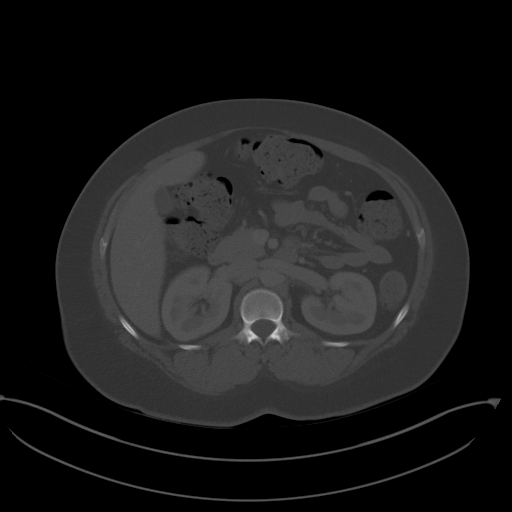
[im 67/91  soft-tissue]
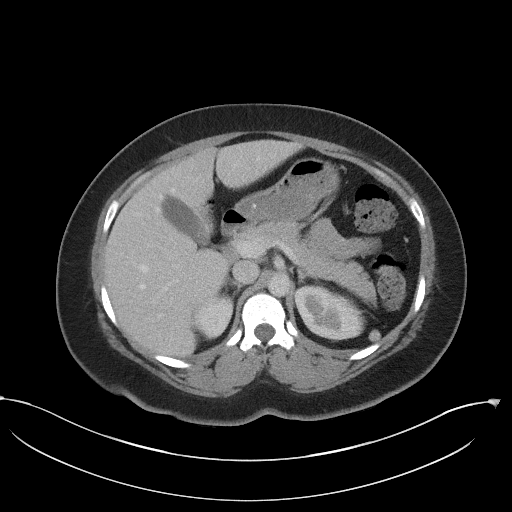
[im 73/91  soft-tissue]
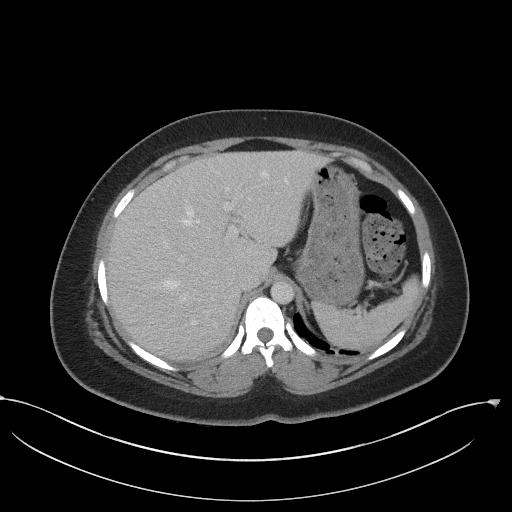
[im 79/91  soft-tissue]
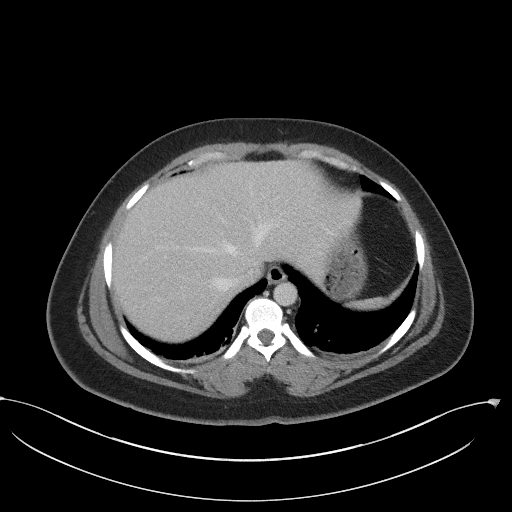
[im 85/91  soft-tissue]
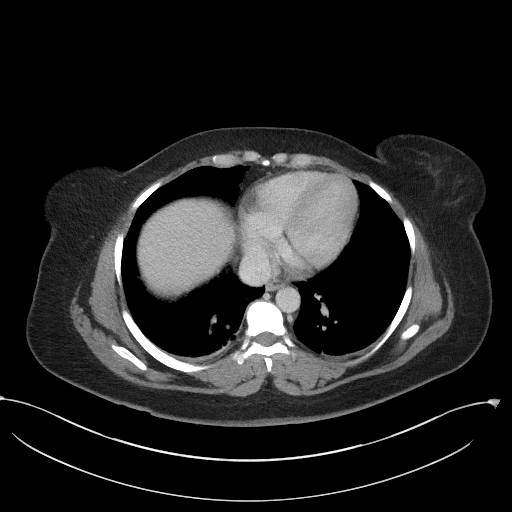

[Series 5: coronal st · coronal · 0.79mm/px · 3 of 88 slices shown]
[im 30/88  soft-tissue]
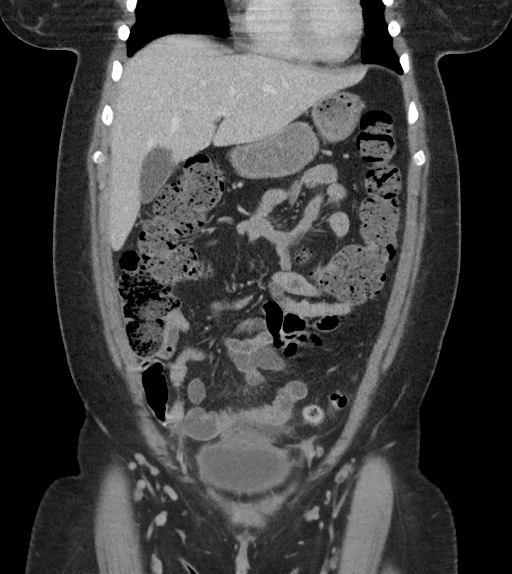
[im 39/88  soft-tissue]
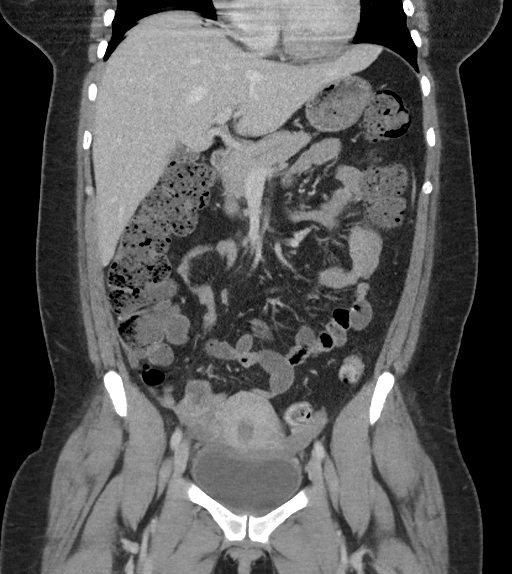
[im 49/88  soft-tissue]
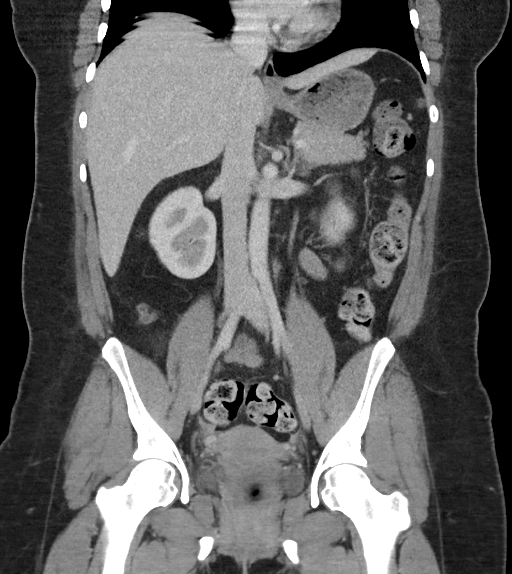

[16 of 46 positions shown; findings below may reference images not displayed]

FINDINGS: Lower chest: Mild dependent atelectasis in the lung bases.

Hepatobiliary: No focal liver abnormality is seen. No gallstones,
gallbladder wall thickening, or biliary dilatation.

Pancreas: Unremarkable. No pancreatic ductal dilatation or
surrounding inflammatory changes.

Spleen: Normal in size without focal abnormality.

Adrenals/Urinary Tract: Kidneys in ureters are unremarkable. Bladder
wall is thickened suggesting cystitis.

Stomach/Bowel: Stomach, small bowel, and colon are not abnormally
distended. Colon is diffusely stool-filled. No wall thickening or
inflammatory changes. Appendix is normal.

Vascular/Lymphatic: No significant vascular findings are present. No
enlarged abdominal or pelvic lymph nodes.

Reproductive: The uterus is anteverted. Lower uterine segment
endometrium is thickened. There is a defect in the anterior
myometrial wall with fluid in the fat anterior to the uterus. This
may be related to history of C-section. However uterine perforation,
abscess, or endometriosis could also have this appearance. Similar
findings are suggested on the previous ultrasound pelvis. Suggest
follow-up ultrasound of the pelvis for further evaluation. No
abnormal adnexal masses.

Other: No free air or free fluid in the abdomen. Subcutaneous
emphysema in the anterior abdominal wall is probably postoperative.
No discrete abscess.

Musculoskeletal: No acute or significant osseous findings.
IMPRESSION: 1. Abnormal appearance of the uterus with an apparent defect in the
anterior myometrial wall with fluid anterior to the uterus.
Expansion of the endometrial cavity in the lower uterine segment.
Similar changes were present on the previous ultrasound pelvis.
Differential diagnosis would include uterine perforation, abscess,
or endometriosis. Suggest follow-up ultrasound of the pelvis for
further evaluation.
2. No evidence of bowel obstruction or inflammation.
3. Subcutaneous emphysema in the anterior abdominal wall likely
postoperative.
4. Diffuse bladder wall thickening suggesting cystitis.
